# Patient Record
Sex: Female | Born: 2000
Health system: Southern US, Community
[De-identification: ages and names within clinical notes are randomized; demographics above are authoritative.]

## PROBLEM LIST (undated history)

## (undated) DIAGNOSIS — G43909 Migraine, unspecified, not intractable, without status migrainosus: Secondary | ICD-10-CM

## (undated) DIAGNOSIS — R51 Headache: Secondary | ICD-10-CM

## (undated) HISTORY — DX: Migraine, unspecified, not intractable, without status migrainosus: G43.909

## (undated) HISTORY — DX: Headache: R51

---
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
## 2018-02-07 ENCOUNTER — Other Ambulatory Visit: Payer: Self-pay | Admitting: Physician Assistant

## 2018-04-10 ENCOUNTER — Ambulatory Visit: Payer: BLUE CROSS/BLUE SHIELD | Admitting: Physician Assistant

## 2018-04-28 ENCOUNTER — Other Ambulatory Visit: Payer: Self-pay | Admitting: Physician Assistant

## 2018-05-18 ENCOUNTER — Encounter: Payer: Self-pay | Admitting: Physician Assistant

## 2018-05-18 ENCOUNTER — Ambulatory Visit: Payer: Self-pay | Admitting: Physician Assistant

## 2018-05-18 VITALS — BP 110/70 | HR 72 | Temp 98.4°F | Ht 64.0 in | Wt 216.2 lb

## 2018-05-18 DIAGNOSIS — R635 Abnormal weight gain: Secondary | ICD-10-CM

## 2018-05-18 DIAGNOSIS — N926 Irregular menstruation, unspecified: Secondary | ICD-10-CM

## 2018-05-18 DIAGNOSIS — R059 Cough, unspecified: Secondary | ICD-10-CM

## 2018-05-18 DIAGNOSIS — R05 Cough: Secondary | ICD-10-CM

## 2018-05-18 LAB — CBC
HEMATOCRIT: 38.4 % (ref 36.0–49.0)
HEMOGLOBIN: 13 g/dL (ref 12.0–16.0)
MCHC: 33.8 g/dL (ref 31.0–37.0)
MCV: 83.9 fl (ref 78.0–98.0)
PLATELETS: 254 10*3/uL (ref 150.0–575.0)
RBC: 4.57 Mil/uL (ref 3.80–5.70)
RDW: 13.4 % (ref 11.4–15.5)
WBC: 6.3 10*3/uL (ref 4.5–13.5)

## 2018-05-18 LAB — COMPREHENSIVE METABOLIC PANEL
ALBUMIN: 4 g/dL (ref 3.5–5.2)
ALT: 15 U/L (ref 0–35)
AST: 15 U/L (ref 0–37)
Alkaline Phosphatase: 43 U/L — ABNORMAL LOW (ref 47–119)
BUN: 10 mg/dL (ref 6–23)
CALCIUM: 9.4 mg/dL (ref 8.4–10.5)
CHLORIDE: 105 meq/L (ref 96–112)
CO2: 25 meq/L (ref 19–32)
CREATININE: 0.79 mg/dL (ref 0.40–1.20)
GFR: 99.58 mL/min (ref >60.00)
Glucose, Bld: 94 mg/dL (ref 70–99)
Potassium: 4 mEq/L (ref 3.5–5.1)
Sodium: 137 mEq/L (ref 135–145)
Total Bilirubin: 0.4 mg/dL (ref 0.2–1.2)
Total Protein: 7.1 g/dL (ref 6.0–8.3)

## 2018-05-18 LAB — POCT URINE PREGNANCY: PREG TEST UR: NEGATIVE

## 2018-05-18 LAB — HEMOGLOBIN A1C: Hgb A1c MFr Bld: 5.2 % (ref 4.6–6.5)

## 2018-05-18 LAB — TSH: TSH: 1.24 u[IU]/mL (ref 0.40–5.00)

## 2018-05-18 MED ORDER — LEVONORGEST-ETH ESTRAD 91-DAY 0.15-0.03 MG PO TABS: 1.0000 | ORAL_TABLET | Freq: Every day | ORAL | 4 refills | Status: DC

## 2018-05-18 MED ORDER — AMOXICILLIN-POT CLAVULANATE 875-125 MG PO TABS: 1.0000 | ORAL_TABLET | Freq: Two times a day (BID) | ORAL | 0 refills | Status: DC

## 2018-05-18 NOTE — Patient Instructions (Signed)
It was great to see you!  Start the new birth control 3 month pack. Follow-up when you come back for the winter break, sooner if concerns.  We will contact you with lab results.  For your sinus infection -- start daily Sudafed and over the counter Flonase. If symptoms do not improve, start oral Augmentin (antibiotic). [facility] if symptoms persist despite treatment.  Let's follow-up in 5 months, sooner if you have concerns.  Take care,  Jarold MottoSamantha Aysen Shieh PA-C

## 2018-05-18 NOTE — Progress Notes (Signed)
Barbara Bartlett is a 17 y.o. female here for a new problem.  I acted as a Neurosurgeon for [employer], PA-C Corky Mull, LPN  History of Present Illness:   Chief Complaint  Patient presents with  . Menstrual Problem  . Cough    Cough  This is a new problem. Episode onset: Started Monday. The problem has been gradually worsening. The problem occurs every few hours. The cough is productive of sputum Chilton Si ). Associated symptoms include ear congestion, nasal congestion, postnasal drip and a sore throat. Pertinent negatives include no chills, ear pain, fever, headaches, shortness of breath or wheezing. Nothing aggravates the symptoms. Risk factors for lung disease include occupational exposure. She has tried OTC cough suppressant for the symptoms. The treatment provided no relief. There is no history of asthma, bronchitis or pneumonia.  Female GU Problem  The patient's primary symptoms include vaginal bleeding. Primary symptoms comment: Pt having irregular periods since April,  haivng period 1/2 way through pack and then no period at end of pack. Bleeding is lasting 5 days like a normal period is heavier.. This is a new problem. Episode frequency: It has happened 2-3 times since April. The problem has been unchanged. The patient is experiencing no pain. She is not pregnant. Associated symptoms include a sore throat. Pertinent negatives include no chills, constipation, fever, headaches, nausea or vomiting. The vaginal discharge was normal. The vaginal bleeding is heavier than menses. She has not been passing clots. She has not been passing tissue. Nothing aggravates the symptoms.    Weight gain She has had 15 pound weight gain since she last saw me.  She reports that over the summer she is not at school, and has less physical activity, attributes this to her weight gain.  She denies any specific eating changes.  She denies prior history of PCOS or diabetes.  She denies family history of this as  well.   Wt Readings from Last 5 Encounters:  05/18/18 216 lb 4 oz (98.1 kg) (98 %, Z= 2.16)*  04/10/17 201 lb 8 oz (91.4 kg) (98 %, Z= 2.00)*  05/07/12 142 lb (64.4 kg) (93 %, Z= 1.47)*  10/17/11 134 lb 4.8 oz (60.9 kg) (93 %, Z= 1.44)*  05/03/11 126 lb 12.8 oz (57.5 kg) (92 %, Z= 1.40)*   * Growth percentiles are based on CDC (Girls, 2-20 Years) data.     Past Medical History:  Diagnosis Date  . Headache(784.0)   . Migraines      Social History   Socioeconomic History  . Marital status: Single    Spouse name: Not on file  . Number of children: Not on file  . Years of education: Not on file  . Highest education level: Not on file  Occupational History  . Not on file  Social Needs  . Financial resource strain: Not on file  . Food insecurity:    Worry: Not on file    Inability: Not on file  . Transportation needs:    Medical: Not on file    Non-medical: Not on file  Tobacco Use  . Smoking status: Never Smoker  . Smokeless tobacco: Never Used  Substance and Sexual Activity  . Alcohol use: No  . Drug use: No  . Sexual activity: Never  Lifestyle  . Physical activity:    Days per week: Not on file    Minutes per session: Not on file  . Stress: Not on file  Relationships  . Social connections:  Talks on phone: Not on file    Gets together: Not on file    Attends religious service: Not on file    Active member of club or organization: Not on file    Attends meetings of clubs or organizations: Not on file    Relationship status: Not on file  . Intimate partner violence:    Fear of current or ex partner: Not on file    Emotionally abused: Not on file    Physically abused: Not on file    Forced sexual activity: Not on file  Other Topics Concern  . Not on file  Social History Narrative   Going to [employer] in the fall --> elementary education    History reviewed. No pertinent surgical history.  Family History  Problem Relation Age of Onset  .  Hypertension Father   . Diabetes Maternal Aunt        diet control  . Cancer Maternal Uncle        melanoma  . Alzheimer's disease Paternal Grandmother   . Heart disease Maternal Grandmother   . Heart disease Maternal Grandfather   . Hypertension Maternal Grandfather   . Alzheimer's disease Paternal Grandfather   . Colon cancer Neg Hx   . Breast cancer Neg Hx     No Known Allergies  Current Medications:   Current Outpatient Medications:  .  acetaminophen (TYLENOL) 500 MG tablet, Take 500 mg by mouth every 6 (six) hours as needed for headache., Disp: , Rfl:  .  ibuprofen (ADVIL,MOTRIN) 200 MG tablet, Take 400 mg by mouth every 4 (four) hours as needed for headache., Disp: , Rfl:  .  SRONYX 0.1-20 MG-MCG tablet, TAKE 1 TABLET BY MOUTH EVERY DAY, Disp: 84 tablet, Rfl: 0 .  amoxicillin-clavulanate (AUGMENTIN) 875-125 MG tablet, Take 1 tablet by mouth 2 (two) times daily., Disp: 20 tablet, Rfl: 0 .  levonorgestrel-ethinyl estradiol (SEASONALE,INTROVALE,JOLESSA) 0.15-0.03 MG tablet, Take 1 tablet by mouth daily., Disp: 1 Package, Rfl: 4   Review of Systems:   Review of Systems  Constitutional: Negative for chills and fever.  HENT: Positive for postnasal drip and sore throat. Negative for ear pain.   Respiratory: Positive for cough. Negative for shortness of breath and wheezing.   Gastrointestinal: Negative for constipation, nausea and vomiting.  Neurological: Negative for headaches.    Vitals:   Vitals:   05/18/18 0811  BP: 110/70  Pulse: 72  Temp: 98.4 F (36.9 C)  TempSrc: Oral  SpO2: 98%  Weight: 216 lb 4 oz (98.1 kg)  Height: 5\' 4"  (1.626 m)     Body mass index is 37.12 kg/m.  Physical Exam:   Physical Exam  Constitutional: She appears well-developed. She is cooperative.  Non-toxic appearance. She does not have a sickly appearance. She does not appear ill. No distress.  HENT:  Head: Normocephalic and atraumatic.  Right Ear: Tympanic membrane, external ear and  ear canal normal. Tympanic membrane is not erythematous, not retracted and not bulging.  Left Ear: Tympanic membrane, external ear and ear canal normal. Tympanic membrane is not erythematous, not retracted and not bulging.  Nose: Right sinus exhibits frontal sinus tenderness. Right sinus exhibits no maxillary sinus tenderness. Left sinus exhibits frontal sinus tenderness. Left sinus exhibits no maxillary sinus tenderness.  Mouth/Throat: Uvula is midline and mucous membranes are normal. Posterior oropharyngeal erythema present. No posterior oropharyngeal edema. Tonsils are 0 on the right. Tonsils are 0 on the left.  Eyes: Conjunctivae and lids are normal.  Neck: Trachea normal.  Cardiovascular: Normal rate, regular rhythm, S1 normal, S2 normal and normal heart sounds.  Pulmonary/Chest: Effort normal and breath sounds normal. She has no decreased breath sounds. She has no wheezes. She has no rhonchi. She has no rales.  Lymphadenopathy:    She has no cervical adenopathy.  Neurological: She is alert.  Skin: Skin is warm, dry and intact.  Psychiatric: She has a normal mood and affect. Her speech is normal and behavior is normal.  Nursing note and vitals reviewed.    Assessment and Plan:    Tawonda was seen today for menstrual problem and cough.  Diagnoses and all orders for this visit:  Irregular bleeding We are going to try a 91-day birth control such as Seasonale.  I recommended that she continue to take her medication daily at the same time every day.  I am going to obtain further labs to investigate any abnormalities consistent with changes in this, including CBC, CMP, TSH, hemoglobin A1c.  I recommended that she follow-up with [place] during her winter break, she is leaving for [place]. Consider work-up for PCOS in future if symptoms persist, especially if ongoing weight gain. -     CBC -     Comprehensive metabolic panel -     TSH -     POCT urine pregnancy  Weight gain She  attributes this to her activity change over the summer.  I would like to check a hemoglobin A1c and a TSH. -     Hemoglobin A1c -     TSH -     POCT urine pregnancy  Cough No red flags on exam.  Discussed use of Sudafed and Flonase per orders. I did prescribe a safety net prescription of Augmentin should her symptoms not improve with symptomatic care. Discussed taking medications as prescribed. Reviewed return precautions including worsening fever, SOB, worsening cough or other concerns. Push fluids and rest. I recommend that patient follow-up if symptoms worsen or persist despite treatment x 7-10 days, sooner if needed.   Other orders -     levonorgestrel-ethinyl estradiol (SEASONALE,INTROVALE,JOLESSA) 0.15-0.03 MG tablet; Take 1 tablet by mouth daily. -     amoxicillin-clavulanate (AUGMENTIN) 875-125 MG tablet; Take 1 tablet by mouth 2 (two) times daily.   . Reviewed expectations re: course of current medical issues. . Discussed self-management of symptoms. . Outlined signs and symptoms indicating need for more acute intervention. . Patient verbalized understanding and all questions were answered. . See orders for this visit as documented in the electronic medical record. . Patient received an After-Visit Summary.   CMA or LPN served as scribe during this visit. History, Physical, and Plan performed by medical provider. Documentation and orders reviewed and attested to.   [name], PA-C

## 2018-07-17 ENCOUNTER — Other Ambulatory Visit: Payer: Self-pay | Admitting: Physician Assistant

## 2018-09-25 ENCOUNTER — Ambulatory Visit (INDEPENDENT_AMBULATORY_CARE_PROVIDER_SITE_OTHER): Payer: Self-pay | Admitting: *Deleted

## 2018-09-25 DIAGNOSIS — Z23 Encounter for immunization: Secondary | ICD-10-CM

## 2018-09-25 DIAGNOSIS — Z111 Encounter for screening for respiratory tuberculosis: Secondary | ICD-10-CM

## 2018-09-25 LAB — TB SKIN TEST
INDURATION: 0 mm
TB SKIN TEST: NEGATIVE

## 2018-09-25 NOTE — Progress Notes (Signed)
Per orders of,Samantha Worley, PA-C injection of Tubersol 0.1 ml left arm intradermally and Flu vaccine Left Deltoid given by Corky Mullonna Cornelia Walraven, LPN Patient tolerated injection well. Pt told to return on Thursday anytime after 11:00 AM for PPD Reading, schedule Nurse Visit. Pt verbalized understanding.

## 2018-09-27 ENCOUNTER — Ambulatory Visit: Payer: Self-pay

## 2018-09-28 ENCOUNTER — Ambulatory Visit (INDEPENDENT_AMBULATORY_CARE_PROVIDER_SITE_OTHER): Payer: BLUE CROSS/BLUE SHIELD | Admitting: *Deleted

## 2018-09-28 ENCOUNTER — Encounter: Payer: Self-pay | Admitting: *Deleted

## 2018-09-28 DIAGNOSIS — Z111 Encounter for screening for respiratory tuberculosis: Secondary | ICD-10-CM

## 2018-09-28 NOTE — Progress Notes (Signed)
Pt presented to office for PPD Reading. PPD was Negative. Paperwork completed given to pt and copy made for chart.

## 2019-01-03 ENCOUNTER — Telehealth: Payer: Self-pay

## 2019-01-03 ENCOUNTER — Ambulatory Visit: Payer: Self-pay | Admitting: Physician Assistant

## 2019-01-03 ENCOUNTER — Ambulatory Visit: Payer: BLUE CROSS/BLUE SHIELD | Admitting: Family Medicine

## 2019-01-03 NOTE — Telephone Encounter (Signed)
Hailey, please schedule Webex

## 2019-01-03 NOTE — Telephone Encounter (Signed)
Spoke to patient.  Advised that we would actually need to see her in the office for an appt for sx of UTI.  I explained that she would need to leave a sterile urine specimen that would need to be tested in office.  We cannot treat her for a UTI w/o first testing her urine.  Pt did not agree with this and chose to cancel her appt.  I advised that if she changes her mind and would like to be seen in the office, to please give our office a call to schedule appt.  She verbalized understanding.

## 2019-01-03 NOTE — Telephone Encounter (Signed)
I returned pt's call from where she left a voicemail c/o having "pain with urination".  See triage notes.   Having pain when urinating.   No other symptoms.   Had a UTI in Dec. But does not get frequent UTIs.  She did not want to come into the office.   I let her know we are doing video chats/phone call visits to avoid having to come in.    I let her know someone would be calling her to set this up.   She was agreeable to this plan.  I verified her e mail and phone number were correct.   I sent these notes to [facility] nurse pool and made them aware of her call.     Reason for Disposition . Urinating more frequently than usual (i.e., frequency)    Burning with urination.  Answer Assessment - Initial Assessment Questions 1. SYMPTOM: "What's the main symptom you're concerned about?" (e.g., frequency, incontinence)     I'm having pain with urination.    2. ONSET: "When did the  burning  start?"     Began 2-3 days ago 3. PAIN: "Is there any pain?" If so, ask: "How bad is it?" (Scale: 1-10; mild, moderate, severe)     No abd pain.   Just when urinating.   No other symptoms.  I had a UTI in Dec.   Not get often. 4. CAUSE: "What do you think is causing the symptoms?"     UTI 5. OTHER SYMPTOMS: "Do you have any other symptoms?" (e.g., fever, flank pain, blood in urine, pain with urination)     See above no other symptoms. 6. PREGNANCY: "Is there any chance you are pregnant?" "When was your last menstrual period?"     No  Protocols used: URINARY [facility]

## 2019-08-27 ENCOUNTER — Other Ambulatory Visit: Payer: Self-pay

## 2019-08-27 ENCOUNTER — Telehealth: Payer: Self-pay | Admitting: Physician Assistant

## 2019-08-27 MED ORDER — LEVONORGEST-ETH ESTRAD 91-DAY 0.15-0.03 MG PO TABS: 1.0000 | ORAL_TABLET | Freq: Every day | ORAL | 0 refills | Status: DC

## 2019-08-27 NOTE — Telephone Encounter (Signed)
She is scheduled for 09/26/19! Thanks!

## 2019-08-27 NOTE — Telephone Encounter (Signed)
Last seen on 05/18/2018. Ok to refill until appointment? Will call and have her make one before we call in.

## 2019-08-27 NOTE — Telephone Encounter (Signed)
Pt needs a refill on mylan bcp. [facility] [address] [place]. Pt will be home 09-14-2019 to 10-22-2019 and will need a cpe during that time frame

## 2019-08-27 NOTE — Telephone Encounter (Signed)
Ok to fill 

## 2019-08-27 NOTE — Telephone Encounter (Signed)
Please call to make app for when she is in town. Let me know when made so that I can call in meds.

## 2019-09-26 ENCOUNTER — Other Ambulatory Visit: Payer: Self-pay

## 2019-09-26 ENCOUNTER — Encounter: Payer: Self-pay | Admitting: Physician Assistant

## 2019-09-26 ENCOUNTER — Ambulatory Visit (INDEPENDENT_AMBULATORY_CARE_PROVIDER_SITE_OTHER): Payer: BLUE CROSS/BLUE SHIELD | Admitting: Physician Assistant

## 2019-09-26 VITALS — BP 120/78 | HR 79 | Temp 98.2°F | Ht 64.0 in | Wt 231.0 lb

## 2019-09-26 DIAGNOSIS — Z8669 Personal history of other diseases of the nervous system and sense organs: Secondary | ICD-10-CM

## 2019-09-26 DIAGNOSIS — Z0000 Encounter for general adult medical examination without abnormal findings: Secondary | ICD-10-CM | POA: Diagnosis not present

## 2019-09-26 DIAGNOSIS — E669 Obesity, unspecified: Secondary | ICD-10-CM | POA: Diagnosis not present

## 2019-09-26 DIAGNOSIS — Z13220 Encounter for screening for lipoid disorders: Secondary | ICD-10-CM

## 2019-09-26 DIAGNOSIS — Z136 Encounter for screening for cardiovascular disorders: Secondary | ICD-10-CM | POA: Diagnosis not present

## 2019-09-26 DIAGNOSIS — Z23 Encounter for immunization: Secondary | ICD-10-CM

## 2019-09-26 LAB — CBC WITH DIFFERENTIAL/PLATELET
Basophils Absolute: 0 10*3/uL (ref 0.0–0.1)
Basophils Relative: 0.7 % (ref 0.0–3.0)
Eosinophils Absolute: 0.3 10*3/uL (ref 0.0–0.7)
Eosinophils Relative: 4.6 % (ref 0.0–5.0)
HCT: 38.8 % (ref 36.0–46.0)
Hemoglobin: 13 g/dL (ref 12.0–15.0)
Lymphocytes Relative: 38.9 % (ref 12.0–46.0)
Lymphs Abs: 2.6 10*3/uL (ref 0.7–4.0)
MCHC: 33.4 g/dL (ref 30.0–36.0)
MCV: 84.4 fl (ref 78.0–100.0)
Monocytes Absolute: 0.5 10*3/uL (ref 0.1–1.0)
Monocytes Relative: 6.9 % (ref 3.0–12.0)
Neutro Abs: 3.2 10*3/uL (ref 1.4–7.7)
Neutrophils Relative %: 48.9 % (ref 43.0–77.0)
Platelets: 302 10*3/uL (ref 150.0–400.0)
RBC: 4.6 Mil/uL (ref 3.87–5.11)
RDW: 13.4 % (ref 11.5–14.6)
WBC: 6.6 10*3/uL (ref 4.5–10.5)

## 2019-09-26 LAB — LIPID PANEL
Cholesterol: 178 mg/dL (ref 0–200)
HDL: 49.3 mg/dL (ref >39.00)
LDL Cholesterol: 106 mg/dL — ABNORMAL HIGH (ref 0–99)
NonHDL: 128.85
Total CHOL/HDL Ratio: 4
Triglycerides: 115 mg/dL (ref 0.0–149.0)
VLDL: 23 mg/dL (ref 0.0–40.0)

## 2019-09-26 LAB — COMPREHENSIVE METABOLIC PANEL
ALT: 16 U/L (ref 0–35)
AST: 15 U/L (ref 0–37)
Albumin: 4.1 g/dL (ref 3.5–5.2)
Alkaline Phosphatase: 49 U/L (ref 39–117)
BUN: 11 mg/dL (ref 6–23)
CO2: 25 mEq/L (ref 19–32)
Calcium: 9.1 mg/dL (ref 8.4–10.5)
Chloride: 106 mEq/L (ref 96–112)
Creatinine, Ser: 0.68 mg/dL (ref 0.40–1.20)
GFR: 109.85 mL/min (ref >60.00)
Glucose, Bld: 88 mg/dL (ref 70–99)
Potassium: 4.1 mEq/L (ref 3.5–5.1)
Sodium: 136 mEq/L (ref 135–145)
Total Bilirubin: 0.4 mg/dL (ref 0.2–1.2)
Total Protein: 6.7 g/dL (ref 6.0–8.3)

## 2019-09-26 LAB — HEMOGLOBIN A1C: Hgb A1c MFr Bld: 5.1 % (ref 4.6–6.5)

## 2019-09-26 MED ORDER — LEVONORGEST-ETH ESTRAD 91-DAY 0.15-0.03 MG PO TABS: 1.0000 | ORAL_TABLET | Freq: Every day | ORAL | 3 refills | Status: DC

## 2019-09-26 NOTE — Patient Instructions (Addendum)
It was great to see you!  GOALS: 1. Please work on healthy choices or half portions when getting food out and about 2. Try to add in at least 2-3 days of exercise (walking counts!) during the week 3. Try to choose lower calorie drinks at [facility]  Please go to the lab for blood work.   Our office will call you with your results unless you have chosen to receive results via MyChart.  If your blood work is normal we will follow-up each year for physicals and as scheduled for chronic medical problems.  If anything is abnormal we will treat accordingly and get you in for a follow-up.  Take care,  [facility] Maintenance, Female Adopting a healthy lifestyle and getting preventive care are important in promoting health and wellness. Ask your health care provider about:  The right schedule for you to have regular tests and exams.  Things you can do on your own to prevent diseases and keep yourself healthy. What should I know about diet, weight, and exercise? Eat a healthy diet   Eat a diet that includes plenty of vegetables, fruits, low-fat dairy products, and lean protein.  Do not eat a lot of foods that are high in solid fats, added sugars, or sodium. Maintain a healthy weight Body mass index (BMI) is used to identify weight problems. It estimates body fat based on height and weight. Your health care provider can help determine your BMI and help you achieve or maintain a healthy weight. Get regular exercise Get regular exercise. This is one of the most important things you can do for your health. Most adults should:  Exercise for at least 150 minutes each week. The exercise should increase your heart rate and make you sweat (moderate-intensity exercise).  Do strengthening exercises at least twice a week. This is in addition to the moderate-intensity exercise.  Spend less time sitting. Even light physical activity can be beneficial. Watch cholesterol and blood  lipids Have your blood tested for lipids and cholesterol at 20 years of age, then have this test every 5 years. Have your cholesterol levels checked more often if:  Your lipid or cholesterol levels are high.  You are older than 20 years of age.  You are at high risk for heart disease. What should I know about cancer screening? Depending on your health history and family history, you may need to have cancer screening at various ages. This may include screening for:  Breast cancer.  Cervical cancer.  Colorectal cancer.  Skin cancer.  Lung cancer. What should I know about heart disease, diabetes, and high blood pressure? Blood pressure and heart disease  High blood pressure causes heart disease and increases the risk of stroke. This is more likely to develop in people who have high blood pressure readings, are of African descent, or are overweight.  Have your blood pressure checked: ? Every 3-5 years if you are 58-79 years of age. ? Every year if you are 10 years old or older. Diabetes Have regular diabetes screenings. This checks your fasting blood sugar level. Have the screening done:  Once every three years after age 46 if you are at a normal weight and have a low risk for diabetes.  More often and at a younger age if you are overweight or have a high risk for diabetes. What should I know about preventing infection? Hepatitis B If you have a higher risk for hepatitis B, you should be screened for this  virus. Talk with your health care provider to find out if you are at risk for hepatitis B infection. Hepatitis C Testing is recommended for:  Everyone born from [birth year] through 1967.  Anyone with known risk factors for hepatitis C. Sexually transmitted infections (STIs)  Get screened for STIs, including gonorrhea and chlamydia, if: ? You are sexually active and are younger than 20 years of age. ? You are older than 20 years of age and your health care provider tells you that  you are at risk for this type of infection. ? Your sexual activity has changed since you were last screened, and you are at increased risk for chlamydia or gonorrhea. Ask your health care provider if you are at risk.  Ask your health care provider about whether you are at high risk for HIV. Your health care provider may recommend a prescription medicine to help prevent HIV infection. If you choose to take medicine to prevent HIV, you should first get tested for HIV. You should then be tested every 3 months for as long as you are taking the medicine. Pregnancy  If you are about to stop having your period (premenopausal) and you may become pregnant, seek counseling before you get pregnant.  Take 400 to 800 micrograms (mcg) of folic acid every day if you become pregnant.  Ask for birth control (contraception) if you want to prevent pregnancy. Osteoporosis and menopause Osteoporosis is a disease in which the bones lose minerals and strength with aging. This can result in bone fractures. If you are 63 years old or older, or if you are at risk for osteoporosis and fractures, ask your health care provider if you should:  Be screened for bone loss.  Take a calcium or vitamin D supplement to lower your risk of fractures.  Be given hormone replacement therapy (HRT) to treat symptoms of menopause. Follow these instructions at home: Lifestyle  Do not use any products that contain nicotine or tobacco, such as cigarettes, e-cigarettes, and chewing tobacco. If you need help quitting, ask your health care provider.  Do not use street drugs.  Do not share needles.  Ask your health care provider for help if you need support or information about quitting drugs. Alcohol use  Do not drink alcohol if: ? Your health care provider tells you not to drink. ? You are pregnant, may be pregnant, or are planning to become pregnant.  If you drink alcohol: ? Limit how much you use to 0-1 drink a day. ? Limit  intake if you are breastfeeding.  Be aware of how much alcohol is in your drink. In the [place] one drink equals one 12 oz bottle of beer (355 mL), one 5 oz glass of wine (148 mL), or one 1 oz glass of hard liquor (44 mL). General instructions  Schedule regular health, dental, and eye exams.  Stay current with your vaccines.  Tell your health care provider if: ? You often feel depressed. ? You have ever been abused or do not feel safe at home. Summary  Adopting a healthy lifestyle and getting preventive care are important in promoting health and wellness.  Follow your health care provider's instructions about healthy diet, exercising, and getting tested or screened for diseases.  Follow your health care provider's instructions on monitoring your cholesterol and blood pressure. This information is not intended to replace advice given to you by your health care provider. Make sure you discuss any questions you have with your health care provider. Document  Released: 04/11/2011 Document Revised: 09/19/2018 Document Reviewed: 09/19/2018 Elsevier Patient Education  2020 [employer].

## 2019-09-26 NOTE — Progress Notes (Signed)
I acted as a Neurosurgeon for [employer], PA-C Corky Mull, LPN  Subjective:    Barbara Bartlett is a 18 y.o. female and is here for a comprehensive physical exam.  HPI  Health Maintenance Due  Topic Date Due  . INFLUENZA VACCINE  05/11/2019    Acute Concerns:   Chronic Issues: Headaches -- 2-3 days a week, denies: n/v, blurred/double vision, issues with concentration. Most are mild, sometimes doesn't even need medication. Denies auras. Oral contraceptive use -- uses seasonique regularly, likes this OCP. Denies excessive bleeding or pain when she does have her period q 3 months.  Health Maintenance: Immunizations -- UTD, Flu give today Colonoscopy -- N/A Mammogram -- N/A PAP -- N/A Bone Density -- N/A Diet -- eating out more at home than back at college, not a lot of healthy choices while at home, does more cooking at college Caffeine intake -- drinks [employer] most days of the week,  Sleep habits -- no issues Exercise -- hiking more at school Current Weight -- Weight: 231 lb (104.8 kg)  Weight History: Wt Readings from Last 10 Encounters:  09/26/19 231 lb (104.8 kg)  05/18/18 216 lb 4 oz (98.1 kg) (98 %, Z= 2.16)*  04/10/17 201 lb 8 oz (91.4 kg) (98 %, Z= 2.00)*  05/07/12 142 lb (64.4 kg) (93 %, Z= 1.47)*  10/17/11 134 lb 4.8 oz (60.9 kg) (93 %, Z= 1.44)*  05/03/11 126 lb 12.8 oz (57.5 kg) (92 %, Z= 1.40)*  04/27/10 116 lb 3.2 oz (52.7 kg) (93 %, Z= 1.51)*   * Growth percentiles are based on CDC (Girls, 2-20 Years) data.   Mood -- overall good No LMP recorded. (Menstrual status: Oral contraceptives).   Depression screen PHQ 2/9 09/26/2019  Decreased Interest 0  Down, Depressed, Hopeless 0  PHQ - 2 Score 0  Altered sleeping -  Tired, decreased energy -  Change in appetite -  Feeling bad or failure about yourself  -  Trouble concentrating -  Moving slowly or fidgety/restless -  Suicidal thoughts -  PHQ-9 Score -     Other  providers/specialists: Patient Care Team: [name], [place] as PCP - General (Physician Assistant)   PMHx, SurgHx, SocialHx, Medications, and Allergies were reviewed in the Visit Navigator and updated as appropriate.   Past Medical History:  Diagnosis Date  . Headache(784.0)   . Migraines     History reviewed. No pertinent surgical history.   Family History  Problem Relation Age of Onset  . Hypertension Father   . Diabetes Maternal Aunt        diet control  . Cancer Maternal Uncle        melanoma  . Alzheimer's disease Paternal Grandmother   . Heart disease Maternal Grandmother   . Heart disease Maternal Grandfather   . Hypertension Maternal Grandfather   . Alzheimer's disease Paternal Grandfather   . Colon cancer Neg Hx   . Breast cancer Neg Hx     Social History   Tobacco Use  . Smoking status: Never Smoker  . Smokeless tobacco: Never Used  Substance Use Topics  . Alcohol use: No  . Drug use: No    Review of Systems:   Review of Systems  Constitutional: Negative for chills, fever, malaise/fatigue and weight loss.  HENT: Negative for hearing loss, sinus pain and sore throat.   Eyes: Negative for blurred vision.  Respiratory: Negative for cough and shortness of breath.   Cardiovascular: Negative for chest pain, palpitations  and leg swelling.  Gastrointestinal: Negative for abdominal pain, constipation, diarrhea, heartburn, nausea and vomiting.  Genitourinary: Negative for dysuria, frequency and urgency.  Musculoskeletal: Negative for back pain, myalgias and neck pain.  Skin: Negative for itching and rash.  Neurological: Negative for dizziness, tingling, seizures, loss of consciousness and headaches.  Endo/Heme/Allergies: Negative for polydipsia.  Psychiatric/Behavioral: Negative for depression. The patient is not nervous/anxious.   All other systems reviewed and are negative.   Objective:   BP 120/78 (BP Location: Left Arm, Patient Position: Sitting,  Cuff Size: Large)   Pulse 79   Temp 98.2 F (36.8 C) (Temporal)   Ht 5\' 4"  (1.626 m)   Wt 231 lb (104.8 kg)   SpO2 97%   BMI 39.65 kg/m   General Appearance:    Alert, cooperative, no distress, appears stated age  Head:    Normocephalic, without obvious abnormality, atraumatic  Eyes:    PERRL, conjunctiva/corneas clear, EOM's intact, fundi    benign, both eyes  Ears:    Normal TM's and external ear canals, both ears  Nose:   Nares normal, septum midline, mucosa normal, no drainage    or sinus tenderness  Throat:   Lips, mucosa, and tongue normal; teeth and gums normal  Neck:   Supple, symmetrical, trachea midline, no adenopathy;    thyroid:  no enlargement/tenderness/nodules; no carotid   bruit or JVD  Back:     Symmetric, no curvature, ROM normal, no CVA tenderness  Lungs:     Clear to auscultation bilaterally, respirations unlabored  Chest Wall:    No tenderness or deformity   Heart:    Regular rate and rhythm, S1 and S2 normal, no murmur, rub   or gallop  Breast Exam:    Deferred  Abdomen:     Soft, non-tender, bowel sounds active all four quadrants,    no masses, no organomegaly  Genitalia:    Deferred  Rectal:    Deferred  Extremities:   Extremities normal, atraumatic, no cyanosis or edema  Pulses:   2+ and symmetric all extremities  Skin:   Skin color, texture, turgor normal, no rashes or lesions  Lymph nodes:   Cervical, supraclavicular, and axillary nodes normal  Neurologic:   CNII-XII intact, normal strength, sensation and reflexes    throughout      Assessment/Plan:   Barbara Bartlett was seen today for annual exam.  Diagnoses and all orders for this visit:  Routine physical examination Today patient counseled on age appropriate routine health concerns for screening and prevention, each reviewed and up to date or declined. Immunizations reviewed and up to date or declined. Labs ordered and reviewed. Risk factors for depression reviewed and negative. Hearing function and  visual acuity are intact. ADLs screened and addressed as needed. Functional ability and level of safety reviewed and appropriate. Education, counseling and referrals performed based on assessed risks today. Patient provided with a copy of personalized plan for preventive services. -     CBC with Differential/Platelet -     Comprehensive metabolic panel  Encounter for lipid screening for cardiovascular disease -     Lipid panel  Obesity, unspecified classification, unspecified obesity type, unspecified whether serious comorbidity present Did make 3 specific goals for her today: 1. Please work on healthy choices or half portions when getting food out and about 2. Try to add in at least 2-3 days of exercise (walking counts!) during the week 3. [facility] to [facility] lower [facility] Will check HgbA1c due  to weight gain. -     Hemoglobin A1c  History of migraine headaches Patient declines further intervention. No red flags on exam. Recommended that she follow-up if HA become more severe or change in any way.  Other orders -     levonorgestrel-ethinyl estradiol (SEASONALE) 0.15-0.03 MG tablet; Take 1 tablet by mouth daily.  Well Adult Exam: Labs ordered: Yes. Patient counseling was done. See below for items discussed. Discussed the patient's BMI. The BMI is not in the acceptable range; BMI management plan is completed Follow up as needed for acute illness.  Patient Counseling:   [x]     Nutrition: Stressed importance of moderation in sodium/caffeine intake, saturated fat and cholesterol, caloric balance, sufficient intake of fresh fruits, vegetables, fiber, calcium, iron, and 1 mg of folate supplement per day (for females capable of pregnancy).   [x]      Stressed the importance of regular exercise.    [x]     Substance Abuse: Discussed cessation/primary prevention of tobacco, alcohol, or other drug use; driving or other dangerous activities under the influence; availability of  treatment for abuse.    [x]      Injury prevention: Discussed safety belts, safety helmets, smoke detector, smoking near bedding or upholstery.    [x]      Sexuality: Discussed sexually transmitted diseases, partner selection, use of condoms, avoidance of unintended pregnancy  and contraceptive alternatives.    [x]     Dental health: Discussed importance of regular tooth brushing, flossing, and dental visits.   [x]      Health maintenance and immunizations reviewed. Please refer to Health maintenance section.   CMA or LPN served as scribe during this visit. History, Physical, and Plan performed by medical provider. The above documentation has been reviewed and is accurate and complete.   Inda Coke, PA-C [place]

## 2019-10-03 ENCOUNTER — Ambulatory Visit: Payer: BLUE CROSS/BLUE SHIELD | Attending: Internal Medicine

## 2019-10-03 DIAGNOSIS — Z20822 Contact with and (suspected) exposure to covid-19: Secondary | ICD-10-CM

## 2019-10-05 LAB — NOVEL CORONAVIRUS, NAA: SARS-CoV-2, NAA: NOT DETECTED

## 2019-12-25 ENCOUNTER — Other Ambulatory Visit: Payer: Self-pay | Admitting: Physician Assistant

## 2020-02-07 ENCOUNTER — Telehealth: Payer: Self-pay | Admitting: Physician Assistant

## 2020-02-07 NOTE — Telephone Encounter (Signed)
ERROR

## 2020-04-25 ENCOUNTER — Other Ambulatory Visit: Payer: Self-pay | Admitting: Physician Assistant

## 2020-04-27 ENCOUNTER — Telehealth: Payer: Self-pay | Admitting: Physician Assistant

## 2020-04-27 MED ORDER — LEVONORGESTREL-ETHINYL ESTRAD 0.15-30 MG-MCG PO TABS: 1.0000 | ORAL_TABLET | Freq: Every day | ORAL | 1 refills | Status: DC

## 2020-04-27 NOTE — Telephone Encounter (Signed)
MEDICATION: levonorgestrel-ethinyl estradiol (NORDETTE) 0.15-30 MG-MCG tablet  PHARMACY:  [id number] [place], [place] - [address] Phone:  [phone]  Fax:  [phone]       Comments: Patient would like to see if this could have refills for up to a year.   **Let patient know to contact pharmacy at the end of the day to make sure medication is ready. **  ** Please notify patient to allow 48-72 hours to process**  **Encourage patient to contact the pharmacy for refills or they can request refills through [facility]**

## 2020-04-27 NOTE — Telephone Encounter (Signed)
Rx sent to pharmacy   

## 2020-06-17 DIAGNOSIS — Z20828 Contact with and (suspected) exposure to other viral communicable diseases: Secondary | ICD-10-CM | POA: Diagnosis not present

## 2020-07-12 ENCOUNTER — Other Ambulatory Visit: Payer: Self-pay | Admitting: Physician Assistant

## 2020-07-13 DIAGNOSIS — Z20828 Contact with and (suspected) exposure to other viral communicable diseases: Secondary | ICD-10-CM | POA: Diagnosis not present

## 2020-07-21 ENCOUNTER — Ambulatory Visit: Payer: BC Managed Care – PPO | Admitting: Physician Assistant

## 2020-07-21 ENCOUNTER — Encounter: Payer: Self-pay | Admitting: Physician Assistant

## 2020-07-21 ENCOUNTER — Other Ambulatory Visit: Payer: Self-pay

## 2020-07-21 VITALS — BP 110/78 | HR 92 | Temp 98.0°F | Ht 64.0 in | Wt 231.4 lb

## 2020-07-21 DIAGNOSIS — Z3041 Encounter for surveillance of contraceptive pills: Secondary | ICD-10-CM

## 2020-07-21 DIAGNOSIS — Z111 Encounter for screening for respiratory tuberculosis: Secondary | ICD-10-CM

## 2020-07-21 DIAGNOSIS — Z23 Encounter for immunization: Secondary | ICD-10-CM | POA: Diagnosis not present

## 2020-07-21 DIAGNOSIS — G43009 Migraine without aura, not intractable, without status migrainosus: Secondary | ICD-10-CM

## 2020-07-21 DIAGNOSIS — Z0000 Encounter for general adult medical examination without abnormal findings: Secondary | ICD-10-CM | POA: Diagnosis not present

## 2020-07-21 DIAGNOSIS — E669 Obesity, unspecified: Secondary | ICD-10-CM

## 2020-07-21 MED ORDER — LEVONORGEST-ETH ESTRAD 91-DAY 0.15-0.03 MG PO TABS: 1.0000 | ORAL_TABLET | Freq: Every day | ORAL | 4 refills | Status: DC

## 2020-07-21 NOTE — Patient Instructions (Signed)
It was great to see you!  Please go to the lab for blood work for your TB blood test. We will send you the results of this through MyChart.  Please try to find a mental health counselor at school.  Continue to work on trying to be active.  I have sent in an updated birth control prescription that should be more convenient for you.  Let's do a Pap smear at your convenience.  Our office will call you with your results unless you have chosen to receive results via MyChart.  If your blood work is normal we will follow-up each year for physicals and as scheduled for chronic medical problems.  If anything is abnormal we will treat accordingly and get you in for a follow-up.  Take care,  [facility] Maintenance, Female Adopting a healthy lifestyle and getting preventive care are important in promoting health and wellness. Ask your health care provider about:  The right schedule for you to have regular tests and exams.  Things you can do on your own to prevent diseases and keep yourself healthy. What should I know about diet, weight, and exercise? Eat a healthy diet   Eat a diet that includes plenty of vegetables, fruits, low-fat dairy products, and lean protein.  Do not eat a lot of foods that are high in solid fats, added sugars, or sodium. Maintain a healthy weight Body mass index (BMI) is used to identify weight problems. It estimates body fat based on height and weight. Your health care provider can help determine your BMI and help you achieve or maintain a healthy weight. Get regular exercise Get regular exercise. This is one of the most important things you can do for your health. Most adults should:  Exercise for at least 150 minutes each week. The exercise should increase your heart rate and make you sweat (moderate-intensity exercise).  Do strengthening exercises at least twice a week. This is in addition to the moderate-intensity exercise.  Spend less time  sitting. Even light physical activity can be beneficial. Watch cholesterol and blood lipids Have your blood tested for lipids and cholesterol at 21 years of age, then have this test every 5 years. Have your cholesterol levels checked more often if:  Your lipid or cholesterol levels are high.  You are older than 21 years of age.  You are at high risk for heart disease. What should I know about cancer screening? Depending on your health history and family history, you may need to have cancer screening at various ages. This may include screening for:  Breast cancer.  Cervical cancer.  Colorectal cancer.  Skin cancer.  Lung cancer. What should I know about heart disease, diabetes, and high blood pressure? Blood pressure and heart disease  High blood pressure causes heart disease and increases the risk of stroke. This is more likely to develop in people who have high blood pressure readings, are of African descent, or are overweight.  Have your blood pressure checked: ? Every 3-5 years if you are 43-46 years of age. ? Every year if you are 80 years old or older. Diabetes Have regular diabetes screenings. This checks your fasting blood sugar level. Have the screening done:  Once every three years after age 72 if you are at a normal weight and have a low risk for diabetes.  More often and at a younger age if you are overweight or have a high risk for diabetes. What should I know about preventing  infection? Hepatitis B If you have a higher risk for hepatitis B, you should be screened for this virus. Talk with your health care provider to find out if you are at risk for hepatitis B infection. Hepatitis C Testing is recommended for:  Everyone born from [birth year] through 1967.  Anyone with known risk factors for hepatitis C. Sexually transmitted infections (STIs)  Get screened for STIs, including gonorrhea and chlamydia, if: ? You are sexually active and are younger than 21 years of  age. ? You are older than 21 years of age and your health care provider tells you that you are at risk for this type of infection. ? Your sexual activity has changed since you were last screened, and you are at increased risk for chlamydia or gonorrhea. Ask your health care provider if you are at risk.  Ask your health care provider about whether you are at high risk for HIV. Your health care provider may recommend a prescription medicine to help prevent HIV infection. If you choose to take medicine to prevent HIV, you should first get tested for HIV. You should then be tested every 3 months for as long as you are taking the medicine. Pregnancy  If you are about to stop having your period (premenopausal) and you may become pregnant, seek counseling before you get pregnant.  Take 400 to 800 micrograms (mcg) of folic acid every day if you become pregnant.  Ask for birth control (contraception) if you want to prevent pregnancy. Osteoporosis and menopause Osteoporosis is a disease in which the bones lose minerals and strength with aging. This can result in bone fractures. If you are 90 years old or older, or if you are at risk for osteoporosis and fractures, ask your health care provider if you should:  Be screened for bone loss.  Take a calcium or vitamin D supplement to lower your risk of fractures.  Be given hormone replacement therapy (HRT) to treat symptoms of menopause. Follow these instructions at home: Lifestyle  Do not use any products that contain nicotine or tobacco, such as cigarettes, e-cigarettes, and chewing tobacco. If you need help quitting, ask your health care provider.  Do not use street drugs.  Do not share needles.  Ask your health care provider for help if you need support or information about quitting drugs. Alcohol use  Do not drink alcohol if: ? Your health care provider tells you not to drink. ? You are pregnant, may be pregnant, or are planning to become  pregnant.  If you drink alcohol: ? Limit how much you use to 0-1 drink a day. ? Limit intake if you are breastfeeding.  Be aware of how much alcohol is in your drink. In the [place] one drink equals one 12 oz bottle of beer (355 mL), one 5 oz glass of wine (148 mL), or one 1 oz glass of hard liquor (44 mL). General instructions  Schedule regular health, dental, and eye exams.  Stay current with your vaccines.  Tell your health care provider if: ? You often feel depressed. ? You have ever been abused or do not feel safe at home. Summary  Adopting a healthy lifestyle and getting preventive care are important in promoting health and wellness.  Follow your health care provider's instructions about healthy diet, exercising, and getting tested or screened for diseases.  Follow your health care provider's instructions on monitoring your cholesterol and blood pressure. This information is not intended to replace advice given to you by  your health care provider. Make sure you discuss any questions you have with your health care provider. Document Revised: 09/19/2018 Document Reviewed: 09/19/2018 Elsevier Patient Education  2020 [employer].

## 2020-07-21 NOTE — Progress Notes (Signed)
Subjective:    Barbara Bartlett is a 19 y.o. female and is here for a comprehensive physical exam.  HPI  Health Maintenance Due  Topic Date Due  . Hepatitis C Screening  Never done  . PAP-Cervical Cytology Screening  Never done  . PAP SMEAR-Modifier  Never done  . INFLUENZA VACCINE  05/10/2020    Acute Concerns: None  Chronic Issues: Migraines without auras -- overall well controlled; does not get frequently. Takes OTC medication for this. Denies auras, does get light/sound sensitivity.  Health Maintenance: Immunizations -- getting flu shot and TDap today Colonoscopy -- n/a Mammogram -- n/a PAP -- counseled, defer to next visit Bone Density -- n/a Diet -- breakfast on weekends only; limited time to make breakfast; has tried dieting without significant results Caffeine intake -- coffee - 1 a day -- sugary Sleep habits -- no conerns Exercise -- walking to and from classes; will be walking a dog soon Current Weight -- Weight: 231 lb 6.1 oz (105 kg)  Weight History: Wt Readings from Last 10 Encounters:  07/21/20 231 lb 6.1 oz (105 kg)  09/26/19 231 lb (104.8 kg)  05/18/18 216 lb 4 oz (98.1 kg) (98 %, Z= 2.16)*  04/10/17 201 lb 8 oz (91.4 kg) (98 %, Z= 2.00)*  05/07/12 142 lb (64.4 kg) (93 %, Z= 1.47)*  10/17/11 134 lb 4.8 oz (60.9 kg) (93 %, Z= 1.44)*  05/03/11 126 lb 12.8 oz (57.5 kg) (92 %, Z= 1.40)*  04/27/10 116 lb 3.2 oz (52.7 kg) (93 %, Z= 1.51)*   * Growth percentiles are based on CDC (Girls, 2-20 Years) data.   Body mass index is 39.72 kg/m. Mood -- issues with anxiety at times No LMP recorded. (Menstrual status: Oral contraceptives). Birth control -- 58-month cycles  Depression screen [facility] 2/9 09/26/2019  Decreased Interest 0  Down, Depressed, Hopeless 0  PHQ - 2 Score 0  Altered sleeping -  Tired, decreased energy -  Change in appetite -  Feeling bad or failure about yourself  -  Trouble concentrating -  Moving slowly or fidgety/restless -  Suicidal  thoughts -  PHQ-9 Score -     Other providers/specialists: Patient Care Team: [name], [place] as PCP - General (Physician Assistant)   PMHx, SurgHx, SocialHx, Medications, and Allergies were reviewed in the Visit Navigator and updated as appropriate.   Past Medical History:  Diagnosis Date  . Headache(784.0)   . Migraines     History reviewed. No pertinent surgical history.   Family History  Problem Relation Age of Onset  . Hypertension Father   . Diabetes Maternal Aunt        diet control  . Cancer Maternal Uncle        melanoma  . Alzheimer's disease Paternal Grandmother   . Heart disease Maternal Grandmother   . Heart disease Maternal Grandfather   . Hypertension Maternal Grandfather   . Alzheimer's disease Paternal Grandfather   . Colon cancer Neg Hx   . Breast cancer Neg Hx     Social History   Tobacco Use  . Smoking status: Never Smoker  . Smokeless tobacco: Never Used  Vaping Use  . Vaping Use: Never used  Substance Use Topics  . Alcohol use: No  . Drug use: No    Review of Systems:   Review of Systems  Constitutional: Negative for chills, fever, malaise/fatigue and weight loss.  HENT: Negative for hearing loss, sinus pain and sore throat.   Respiratory:  Negative for cough and hemoptysis.   Cardiovascular: Negative for chest pain, palpitations, leg swelling and PND.  Gastrointestinal: Negative for abdominal pain, constipation, diarrhea, heartburn, nausea and vomiting.  Genitourinary: Negative for dysuria, frequency and urgency.  Musculoskeletal: Negative for back pain, myalgias and neck pain.  Skin: Negative for itching and rash.  Neurological: Negative for dizziness, tingling, seizures and headaches.  Endo/Heme/Allergies: Negative for polydipsia.  Psychiatric/Behavioral: Negative for depression. The patient is not nervous/anxious.     Objective:   BP 110/78 (BP Location: Left Arm, Patient Position: Sitting, Cuff Size: Large)   Pulse 92    Temp 98 F (36.7 C) (Temporal)   Ht 5\' 4"  (1.626 m)   Wt 231 lb 6.1 oz (105 kg)   BMI 39.72 kg/m   General Appearance:    Alert, cooperative, no distress, appears stated age  Head:    Normocephalic, without obvious abnormality, atraumatic  Eyes:    PERRL, conjunctiva/corneas clear, EOM's intact, fundi    benign, both eyes  Ears:    Normal TM's and external ear canals, both ears  Nose:   Nares normal, septum midline, mucosa normal, no drainage    or sinus tenderness  Throat:   Lips, mucosa, and tongue normal; teeth and gums normal  Neck:   Supple, symmetrical, trachea midline, no adenopathy;    thyroid:  no enlargement/tenderness/nodules; no carotid   bruit or JVD  Back:     Symmetric, no curvature, ROM normal, no CVA tenderness  Lungs:     Clear to auscultation bilaterally, respirations unlabored  Chest Wall:    No tenderness or deformity   Heart:    Regular rate and rhythm, S1 and S2 normal, no murmur, rub   or gallop  Breast Exam:    Deferred  Abdomen:     Soft, non-tender, bowel sounds active all four quadrants,    no masses, no organomegaly  Genitalia:    Deferred  Rectal:    Deferred  Extremities:   Extremities normal, atraumatic, no cyanosis or edema  Pulses:   2+ and symmetric all extremities  Skin:   Skin color, texture, turgor normal, no rashes or lesions  Lymph nodes:   Cervical, supraclavicular, and axillary nodes normal  Neurologic:   CNII-XII intact, normal strength, sensation and reflexes    throughout     Assessment/Plan:   Priscille was seen today for complete form.  Diagnoses and all orders for this visit:  Routine physical examination Today patient counseled on age appropriate routine health concerns for screening and prevention, each reviewed and up to date or declined. Immunizations reviewed and up to date or declined. Labs ordered and reviewed. Risk factors for depression reviewed and negative. Hearing function and visual acuity are intact. ADLs screened  and addressed as needed. Functional ability and level of safety reviewed and appropriate. Education, counseling and referrals performed based on assessed risks today. Patient provided with a copy of personalized plan for preventive services.  Need for prophylactic vaccination with combined diphtheria-tetanus-pertussis (DTP) vaccine -     Tdap vaccine greater than or equal to 7yo IM  Screening for tuberculosis -     QuantiFERON-TB Gold Plus; Future  Obesity, unspecified classification, unspecified obesity type, unspecified whether serious comorbidity present Encouraged regular diet and exercise. Avoid dieting and elimination of entire food groups. Encouraged more frequent movement.  Migraine without aura and without status migrainosus, not intractable Overall well controlled. Counseled that if she were to develop auras to let Aundra Millet know.  Contraception management  Doing well with prescription but sometimes having issues getting her rx on time when she skips her periods. She would like a different OCP if possible.  Other orders -     levonorgestrel-ethinyl estradiol (SEASONALE) 0.15-0.03 MG tablet; Take 1 tablet by mouth daily.  Well Adult Exam: Labs ordered: Yes. Patient counseling was done. See below for items discussed. Discussed the patient's BMI. The BMI is not in the acceptable range; BMI management plan is completed Follow up in one year.  Patient Counseling:   [x]     Nutrition: Stressed importance of moderation in sodium/caffeine intake, saturated fat and cholesterol, caloric balance, sufficient intake of fresh fruits, vegetables, fiber, calcium, iron, and 1 mg of folate supplement per day (for females capable of pregnancy).   [x]      Stressed the importance of regular exercise.    [x]     Substance Abuse: Discussed cessation/primary prevention of tobacco, alcohol, or other drug use; driving or other dangerous activities under the influence; availability of treatment for abuse.      [x]      Injury prevention: Discussed safety belts, safety helmets, smoke detector, smoking near bedding or upholstery.    [x]      Sexuality: Discussed sexually transmitted diseases, partner selection, use of condoms, avoidance of unintended pregnancy  and contraceptive alternatives.    [x]     Dental health: Discussed importance of regular tooth brushing, flossing, and dental visits.   [x]      Health maintenance and immunizations reviewed. Please refer to Health maintenance section.   CMA or LPN served as scribe during this visit. History, Physical, and Plan performed by medical provider. The above documentation has been reviewed and is accurate and complete.  , PA-C [facility]

## 2020-07-23 LAB — QUANTIFERON-TB GOLD PLUS
Mitogen-NIL: >10 IU/mL
NIL: 0.03 IU/mL
QuantiFERON-TB Gold Plus: NEGATIVE
TB1-NIL: 0.01 IU/mL
TB2-NIL: 0.02 IU/mL

## 2020-07-30 DIAGNOSIS — Z20828 Contact with and (suspected) exposure to other viral communicable diseases: Secondary | ICD-10-CM | POA: Diagnosis not present

## 2020-08-05 DIAGNOSIS — Z20828 Contact with and (suspected) exposure to other viral communicable diseases: Secondary | ICD-10-CM | POA: Diagnosis not present

## 2020-09-14 ENCOUNTER — Encounter: Payer: Self-pay | Admitting: Physician Assistant

## 2020-09-21 ENCOUNTER — Other Ambulatory Visit: Payer: Self-pay | Admitting: Physician Assistant

## 2021-10-08 ENCOUNTER — Other Ambulatory Visit: Payer: Self-pay | Admitting: Physician Assistant

## 2021-10-19 ENCOUNTER — Encounter: Payer: Self-pay | Admitting: Physician Assistant

## 2021-10-19 ENCOUNTER — Other Ambulatory Visit: Payer: Self-pay

## 2021-10-19 ENCOUNTER — Ambulatory Visit (INDEPENDENT_AMBULATORY_CARE_PROVIDER_SITE_OTHER): Payer: 59 | Admitting: Physician Assistant

## 2021-10-19 VITALS — BP 130/80 | HR 72 | Temp 98.4°F | Ht 65.5 in | Wt 248.0 lb

## 2021-10-19 DIAGNOSIS — G43009 Migraine without aura, not intractable, without status migrainosus: Secondary | ICD-10-CM | POA: Diagnosis not present

## 2021-10-19 DIAGNOSIS — Z136 Encounter for screening for cardiovascular disorders: Secondary | ICD-10-CM

## 2021-10-19 DIAGNOSIS — Z3041 Encounter for surveillance of contraceptive pills: Secondary | ICD-10-CM

## 2021-10-19 DIAGNOSIS — Z0000 Encounter for general adult medical examination without abnormal findings: Secondary | ICD-10-CM | POA: Diagnosis not present

## 2021-10-19 DIAGNOSIS — Z111 Encounter for screening for respiratory tuberculosis: Secondary | ICD-10-CM | POA: Diagnosis not present

## 2021-10-19 DIAGNOSIS — E669 Obesity, unspecified: Secondary | ICD-10-CM

## 2021-10-19 DIAGNOSIS — Z1159 Encounter for screening for other viral diseases: Secondary | ICD-10-CM

## 2021-10-19 DIAGNOSIS — Z13220 Encounter for screening for lipoid disorders: Secondary | ICD-10-CM

## 2021-10-19 LAB — CBC WITH DIFFERENTIAL/PLATELET
Basophils Absolute: 0 10*3/uL (ref 0.0–0.1)
Basophils Relative: 0.7 % (ref 0.0–3.0)
Eosinophils Absolute: 0.3 10*3/uL (ref 0.0–0.7)
Eosinophils Relative: 5.3 % — ABNORMAL HIGH (ref 0.0–5.0)
HCT: 39.4 % (ref 36.0–46.0)
Hemoglobin: 13.1 g/dL (ref 12.0–15.0)
Lymphocytes Relative: 34.8 % (ref 12.0–46.0)
Lymphs Abs: 2.1 10*3/uL (ref 0.7–4.0)
MCHC: 33.1 g/dL (ref 30.0–36.0)
MCV: 85 fl (ref 78.0–100.0)
Monocytes Absolute: 0.5 10*3/uL (ref 0.1–1.0)
Monocytes Relative: 7.9 % (ref 3.0–12.0)
Neutro Abs: 3 10*3/uL (ref 1.4–7.7)
Neutrophils Relative %: 51.3 % (ref 43.0–77.0)
Platelets: 273 10*3/uL (ref 150.0–400.0)
RBC: 4.64 Mil/uL (ref 3.87–5.11)
RDW: 13.1 % (ref 11.5–15.5)
WBC: 5.9 10*3/uL (ref 4.0–10.5)

## 2021-10-19 LAB — COMPREHENSIVE METABOLIC PANEL
ALT: 25 U/L (ref 0–35)
AST: 21 U/L (ref 0–37)
Albumin: 4 g/dL (ref 3.5–5.2)
Alkaline Phosphatase: 39 U/L (ref 39–117)
BUN: 10 mg/dL (ref 6–23)
CO2: 22 mEq/L (ref 19–32)
Calcium: 9.3 mg/dL (ref 8.4–10.5)
Chloride: 106 mEq/L (ref 96–112)
Creatinine, Ser: 0.73 mg/dL (ref 0.40–1.20)
GFR: 116.68 mL/min (ref >60.00)
Glucose, Bld: 82 mg/dL (ref 70–99)
Potassium: 4 mEq/L (ref 3.5–5.1)
Sodium: 135 mEq/L (ref 135–145)
Total Bilirubin: 0.4 mg/dL (ref 0.2–1.2)
Total Protein: 7.1 g/dL (ref 6.0–8.3)

## 2021-10-19 LAB — LIPID PANEL
Cholesterol: 191 mg/dL (ref 0–200)
HDL: 43.6 mg/dL (ref >39.00)
LDL Cholesterol: 115 mg/dL — ABNORMAL HIGH (ref 0–99)
NonHDL: 147.13
Total CHOL/HDL Ratio: 4
Triglycerides: 163 mg/dL — ABNORMAL HIGH (ref 0.0–149.0)
VLDL: 32.6 mg/dL (ref 0.0–40.0)

## 2021-10-19 MED ORDER — RIZATRIPTAN BENZOATE 5 MG PO TABS: 5.0000 mg | ORAL_TABLET | ORAL | 0 refills | Status: DC | PRN

## 2021-10-19 MED ORDER — LEVONORGEST-ETH ESTRAD 91-DAY 0.15-0.03 MG PO TABS: 1.0000 | ORAL_TABLET | Freq: Every day | ORAL | 4 refills | Status: DC

## 2021-10-19 NOTE — Patient Instructions (Addendum)
It was great to see you!  Once we get an ozempic or wegovy sample, we will call you to pick this up. Look up these medications to learn more if you'd like.  Migraine Recommendations: 1.  No daily medications needed at this time for preventing migraines 2.  Take Maxalt 5mg  at earliest onset of headache.  May repeat dose once in 2 hours if needed.  Do not exceed two tablets in 24 hours. 3.  Limit use of pain relievers to no more than 2 days out of the week.  These medications include acetaminophen, ibuprofen, triptans and narcotics.  This will help reduce risk of rebound headaches. 4.  Be aware of common food triggers such as processed sweets, processed foods with nitrites (such as deli meat, hot dogs, sausages), foods with MSG, alcohol (such as wine), chocolate, certain cheeses, certain fruits (dried fruits, bananas, pineapple), vinegar, diet soda. 4.  Avoid caffeine 5.  Routine exercise 6.  Proper sleep hygiene 7.  Stay adequately hydrated with water 8.  Keep a headache diary. 9.  Maintain proper stress management. 10.  Do not skip meals. 11.  Consider supplements:  Magnesium citrate 400mg  to 600mg  daily, riboflavin 400mg , Coenzyme Q 10 100mg  three times daily  Headache Precautions: Contact a doctor if: Your symptoms are not helped by medicine. You have a headache that feels different than the other headaches. You feel sick to your stomach (nauseous) or you throw up (vomit). You have a fever. Get help right away if: Your headache gets very bad quickly. Your headache gets worse after a lot of physical activity. You keep throwing up. You have a stiff neck. You have trouble seeing. You have trouble speaking. You have pain in the eye or ear. Your muscles are weak or you lose muscle control. You lose your balance or have trouble walking. You feel like you will pass out (faint) or you pass out. You are mixed up (confused). You have a seizure.    Please go to the lab for blood work.    Our office will call you with your results unless you have chosen to receive results via MyChart.  If your blood work is normal we will follow-up each year for physicals and as scheduled for chronic medical problems.  If anything is abnormal we will treat accordingly and get you in for a follow-up.  Take care,  

## 2021-10-19 NOTE — Progress Notes (Signed)
Subjective:    Barbara Bartlett is a 21 y.o. female and is here for a comprehensive physical exam.  HPI  Health Maintenance Due  Topic Date Due   Hepatitis C Screening  Never done    Acute Concerns: Obesity [name] has been struggling with her weight for the past year and has decided to get serious about losing the weight. Currently she has been adjusting her diet and is working on increasing her exercise despite the cold weather. Although she is trying to make lifestyle changes, she is interested in trialing a medication for weight loss assistance.   Chronic Issues: Migraines without Auras As of recently, Barbara Bartlett has noticed her migraines have become worse and occurring a couple of times a week. Usually she is able to take 2 Excedrin and find relief, but last migraine on 10/09/21 was only resolved with sleep. At this time she is interested in trialing a medication that could provide her with additional relief. Denies increase in stress, changes in vision, slurred speech, weakness.  Contraception Barbara Bartlett is currently compliant with taking seasonale 0.15-0.03 mg daily with no complications. States that she is managing well with this medication and will need a refill today.   Health Maintenance: Immunizations -- Covid- UTD Influenza- Due;2021 Tdap- UTD;2021 PAP -- Due Bone Density -- N/A Diet -- Eats all food groups- making healthier dietary changes Sleep habits -- Normal schedule Dentistry- UTD Ophthalmology- Will update soon Exercise -- Walking weekly Current Weight -- Stable Weight History: Wt Readings from Last 10 Encounters:  10/19/21 248 lb (112.5 kg)  07/21/20 231 lb 6.1 oz (105 kg)  09/26/19 231 lb (104.8 kg)  05/18/18 216 lb 4 oz (98.1 kg) (98 %, Z= 2.16)*  04/10/17 201 lb 8 oz (91.4 kg) (98 %, Z= 2.00)*  05/07/12 142 lb (64.4 kg) (93 %, Z= 1.47)*  10/17/11 134 lb 4.8 oz (60.9 kg) (93 %, Z= 1.44)*  05/03/11 126 lb 12.8 oz (57.5 kg) (92 %, Z= 1.40)*  04/27/10  116 lb 3.2 oz (52.7 kg) (93 %, Z= 1.51)*   * Growth percentiles are based on CDC (Girls, 2-20 Years) data.   Body mass index is 40.64 kg/m. Mood -- Stable  Patient's last menstrual period was 09/21/2021. Period characteristics -- Normal Birth control -- Seasonale 0.15-0.03 mg daily     reports no history of alcohol use.  Tobacco Use: Low Risk    Smoking Tobacco Use: Never   Smokeless Tobacco Use: Never   Passive Exposure: Not on file     Depression screen [facility] 2/9 10/19/2021  Decreased Interest 0  Down, Depressed, Hopeless 0  PHQ - 2 Score 0  Altered sleeping -  Tired, decreased energy -  Change in appetite -  Feeling bad or failure about yourself  -  Trouble concentrating -  Moving slowly or fidgety/restless -  Suicidal thoughts -  PHQ-9 Score -     Other providers/specialists: Patient Care Team: [name], [place] as PCP - General (Physician Assistant)   PMHx, SurgHx, SocialHx, Medications, and Allergies were reviewed in the Visit Navigator and updated as appropriate.   Past Medical History:  Diagnosis Date   Headache(784.0)    Migraines     History reviewed. No pertinent surgical history.   Family History  Problem Relation Age of Onset   Hypertension Father    Diabetes Maternal Aunt        diet control   Cancer Maternal Uncle  melanoma   Alzheimer's disease Paternal Grandmother    Heart disease Maternal Grandmother    Heart disease Maternal Grandfather    Hypertension Maternal Grandfather    Alzheimer's disease Paternal Grandfather    Colon cancer Neg Hx    Breast cancer Neg Hx     Social History   Tobacco Use   Smoking status: Never   Smokeless tobacco: Never  Vaping Use   Vaping Use: Never used  Substance Use Topics   Alcohol use: No   Drug use: No    Review of Systems:   Review of Systems  Constitutional:  Negative for chills, fever, malaise/fatigue and weight loss.  HENT:  Negative for hearing loss, sinus pain and sore  throat.   Respiratory:  Negative for cough and hemoptysis.   Cardiovascular:  Negative for chest pain, palpitations, leg swelling and PND.  Gastrointestinal:  Negative for abdominal pain, constipation, diarrhea, heartburn, nausea and vomiting.  Genitourinary:  Negative for dysuria, frequency and urgency.  Musculoskeletal:  Negative for back pain, myalgias and neck pain.  Skin:  Negative for itching and rash.  Neurological:  Negative for dizziness, tingling, seizures and headaches.  Endo/Heme/Allergies:  Negative for polydipsia.  Psychiatric/Behavioral:  Negative for depression. The patient is not nervous/anxious.    Objective:   BP 130/80 (BP Location: Left Arm, Patient Position: Sitting, Cuff Size: Large)    Pulse 72    Temp 98.4 F (36.9 C) (Temporal)    Ht 5' 5.5" (1.664 m)    Wt 248 lb (112.5 kg)    LMP 09/21/2021 Comment: Takes OC"s continuously x 3 months   BMI 40.64 kg/m   General Appearance:    Alert, cooperative, no distress, appears stated age  Head:    Normocephalic, without obvious abnormality, atraumatic  Eyes:    PERRL, conjunctiva/corneas clear, EOM's intact, fundi    benign, both eyes  Ears:    Normal TM's and external ear canals, both ears  Nose:   Nares normal, septum midline, mucosa normal, no drainage    or sinus tenderness  Throat:   Lips, mucosa, and tongue normal; teeth and gums normal  Neck:   Supple, symmetrical, trachea midline, no adenopathy;    thyroid:  no enlargement/tenderness/nodules; no carotid   bruit or JVD  Back:     Symmetric, no curvature, ROM normal, no CVA tenderness  Lungs:     Clear to auscultation bilaterally, respirations unlabored  Chest Wall:    No tenderness or deformity   Heart:    Regular rate and rhythm, S1 and S2 normal, no murmur, rub   or gallop  Breast Exam:    Deferred  Abdomen:    Soft, non-tender, bowel sounds active all four quadrants,    no masses, no organomegaly  Genitalia:   Deferred  Rectal:   Deferred  Extremities:    Extremities normal, atraumatic, no cyanosis or edema  Pulses:   2+ and symmetric all extremities  Skin:   Skin color, texture, turgor normal, no rashes or lesions  Lymph nodes:   Cervical, supraclavicular, and axillary nodes normal  Neurologic:   CNII-XII intact, normal strength, sensation and reflexes    throughout    Assessment/Plan:   Routine Physical Examination Today patient counseled on age appropriate routine health concerns for screening and prevention, each reviewed and up to date or declined. Immunizations reviewed and up to date or declined. Labs ordered and reviewed. Risk factors for depression reviewed and negative. Hearing function and visual acuity are intact.  ADLs screened and addressed as needed. Functional ability and level of safety reviewed and appropriate. Education, counseling and referrals performed based on assessed risks today. Patient provided with a copy of personalized plan for preventive services.  Migraine without aura and without status migrainosus, not intractable Uncontrolled Start Maxalt 5 mg daily as needed, may repeat in two hours as needed Provided patient with migraine prevention techniques  Informed patient that if new/worsening symptoms occur to reach out to office or visit ER immediately   Obesity, unspecified classification, unspecified obesity type, unspecified whether serious comorbidity present Discussed trialing a weight loss medication, such as wegovy or ozempic-- will provide sample when available  Encouraged patient to continue making healthier dietary changes and increasing daily exercise  Encounter for screening for other viral diseases  - Hepatitis C Antibody   Encounter for lipid screening for cardiovascular disease Update labs today, will start medication as indicated by lab results  - Lipid Panel  Screening-pulmonary TB Completed today   Patient Counseling:   [x]     Nutrition: Stressed importance of moderation in  sodium/caffeine intake, saturated fat and cholesterol, caloric balance, sufficient intake of fresh fruits, vegetables, fiber, calcium, iron, and 1 mg of folate supplement per day (for females capable of pregnancy).   [x]      Stressed the importance of regular exercise.    [x]     Substance Abuse: Discussed cessation/primary prevention of tobacco, alcohol, or other drug use; driving or other dangerous activities under the influence; availability of treatment for abuse.    [x]      Injury prevention: Discussed safety belts, safety helmets, smoke detector, smoking near bedding or upholstery.    [x]      Sexuality: Discussed sexually transmitted diseases, partner selection, use of condoms, avoidance of unintended pregnancy  and contraceptive alternatives.    [x]     Dental health: Discussed importance of regular tooth brushing, flossing, and dental visits.   [x]      Health maintenance and immunizations reviewed. Please refer to Health maintenance section.   I,Havlyn C Ratchford,acting as a for , PA.,have documented all relevant documentation on the behalf of , PA,as directed by  , PA while in the presence of , .  I, , Neurosurgeon, have reviewed all documentation for this visit. The documentation on 10/19/21 for the exam, diagnosis, procedures, and orders are all accurate and complete.   [name], PA-C [facility]

## 2021-10-20 ENCOUNTER — Telehealth: Payer: Self-pay | Admitting: *Deleted

## 2021-10-20 MED ORDER — WEGOVY 0.25 MG/0.5ML ~~LOC~~ SOAJ: 0.2500 mg | SUBCUTANEOUS | 0 refills | Status: DC

## 2021-10-20 NOTE — Telephone Encounter (Signed)
Spoke to pt told her I just received samples of Wegovy to help with weight loss, you can come by and pick up sample just ask for Barbara Bartlett. Pt verbalized understanding.

## 2021-10-20 NOTE — Telephone Encounter (Signed)
Pt came by the to pickup sample.

## 2021-10-22 LAB — QUANTIFERON-TB GOLD PLUS
Mitogen-NIL: >10 IU/mL
NIL: 0.03 IU/mL
QuantiFERON-TB Gold Plus: NEGATIVE
TB1-NIL: 0.01 IU/mL
TB2-NIL: 0.05 IU/mL

## 2021-10-22 LAB — HEPATITIS C ANTIBODY
Hepatitis C Ab: NONREACTIVE
SIGNAL TO CUT-OFF: 0.09 (ref <1.00)

## 2021-11-12 ENCOUNTER — Telehealth: Payer: Self-pay | Admitting: Physician Assistant

## 2021-11-12 NOTE — Telephone Encounter (Signed)
Patient wants to know if she needs to have a prescription for her next supply of Wegovy, or does she need a sample from the office. Please call the patient at [phone]. Thank You!

## 2021-11-12 NOTE — Telephone Encounter (Signed)
Left message on voicemail to call office.  

## 2021-11-15 ENCOUNTER — Telehealth: Payer: Self-pay | Admitting: Physician Assistant

## 2021-11-15 MED ORDER — WEGOVY 0.5 MG/0.5ML ~~LOC~~ SOAJ
0.5000 mg | SUBCUTANEOUS | 0 refills | Status: DC
Start: 2021-11-15 — End: 2022-04-06

## 2021-11-15 NOTE — Telephone Encounter (Signed)
See other message

## 2021-11-15 NOTE — Telephone Encounter (Signed)
Pt states she needs to talk to [facility] regarding Wegovy. Please give her a call back

## 2021-11-15 NOTE — Telephone Encounter (Signed)
Left message on voicemail to call office. Need to know what pharmacy to send [facility] Rx to?

## 2021-11-15 NOTE — Telephone Encounter (Signed)
Spoke to pt told her need to know what pharmacy to send Rx? Pt said [facility] [place]. Told her okay will send Wegovy 0.5 mg once a week to the pharmacy. Asked pt if she checked with her insurance if covered? Pt said no. Told her okay will send Rx may need PA if denied you will need to check with insurance if they cover anything for obesity. Pt verbalized understanding. Rx sent

## 2021-11-18 ENCOUNTER — Telehealth: Payer: Self-pay

## 2021-11-18 NOTE — Telephone Encounter (Signed)
Noted  

## 2021-11-18 NOTE — Telephone Encounter (Signed)
Patient has called in stating insurance will not cover wegovy.  States insurance has informed her that they will however cover ozempic. Patient would like to know if she can get [facility] script [facility].

## 2021-11-18 NOTE — Telephone Encounter (Signed)
Patient called in and states the Reginal Lutes is not covered through her insurance and wants to see if there is anything can be done. Told patient to call her insurance to see if there was any weightless medications that they do cover.

## 2021-11-19 NOTE — Telephone Encounter (Signed)
Barbara Bartlett, pt is wanting a Rx for Ozempic since Reginal Lutes was denied by insurance. Please advise if okay to send Rx?

## 2021-11-22 MED ORDER — OZEMPIC (0.25 OR 0.5 MG/DOSE) 2 MG/1.5ML ~~LOC~~ SOPN: 0.5000 mg | PEN_INJECTOR | SUBCUTANEOUS | 0 refills | Status: DC

## 2021-11-22 NOTE — Telephone Encounter (Signed)
Left message on voicemail Rx was sent to pharmacy as requested. Any questions call office.

## 2021-11-23 ENCOUNTER — Telehealth: Payer: Self-pay | Admitting: *Deleted

## 2021-11-23 NOTE — Telephone Encounter (Signed)
(  Key: [id number]) Rx #: [id number] Ozempic (0.25 or 0.5 MG/DOSE) 2MG /1.5ML pen-injectors Waiting for determination

## 2021-11-26 NOTE — Telephone Encounter (Signed)
Patient notified

## 2021-11-26 NOTE — Telephone Encounter (Signed)
°  Request Reference Number: [id number]. OZEMPIC INJ 2/1.5ML is denied for not meeting the prior authorization requirement

## 2021-12-02 ENCOUNTER — Telehealth: Payer: Self-pay | Admitting: *Deleted

## 2021-12-02 NOTE — Telephone Encounter (Signed)
(  Key: [id number]) Rx #: [id number] Ozempic (0.25 or 0.5 MG/DOSE) 2MG /1.5ML pen-injectors Waiting for determination

## 2021-12-13 ENCOUNTER — Other Ambulatory Visit: Payer: Self-pay | Admitting: Physician Assistant

## 2021-12-15 NOTE — Telephone Encounter (Signed)
We received your request for prior authorization for [facility], for the above member; however, [employer] has a denied request on file for OZEMPIC  ?Denied request send to be scan on patient chart  ?Left voice message to patient with denied information  ?

## 2021-12-20 NOTE — Telephone Encounter (Signed)
Patient verified DOB, advised denial of Ozempic and stated she spoke with insurance when [facility] was originally denied. She states she doesn't think any will be covered under her plan. Pt will make an appt to discuss further if needed. Pt verbalized understanding.  ?

## 2021-12-24 ENCOUNTER — Telehealth: Payer: Self-pay | Admitting: *Deleted

## 2021-12-24 NOTE — Telephone Encounter (Signed)
(  Key: [id number]) ?Rx #: [id number] ?Ozempic (0.25 or 0.5 MG/DOSE) 2MG /1.5ML pen-injectors ?Waiting for determination  ?

## 2021-12-29 ENCOUNTER — Telehealth: Payer: Self-pay

## 2021-12-29 NOTE — Telephone Encounter (Signed)
LVM for pt to advise PA for Ozempic was denied. Please reach out to insurance to see if there are any alternatives. Left callback number for pt if needing to call us back.  ?

## 2021-12-29 NOTE — Telephone Encounter (Signed)
Message from Plan ?We received your request for prior authorization for [facility], for the above member; however, [employer] has a denied request on file for [facility] for this member ?

## 2021-12-30 ENCOUNTER — Encounter: Payer: Self-pay | Admitting: Physician Assistant

## 2022-01-04 ENCOUNTER — Telehealth: Payer: Self-pay | Admitting: *Deleted

## 2022-01-04 NOTE — Telephone Encounter (Signed)
(  Key: [id number]) ?Rx #: [id number] ?Ozempic (0.25 or 0.5 MG/DOSE) 2MG /1.5ML pen-injectors ?Waiting for determination ?

## 2022-01-04 NOTE — Telephone Encounter (Signed)
denied request on file for [facility] for this member. ?Letter for cancellation printed and placed to be scan on pt chart  ?

## 2022-01-05 NOTE — Telephone Encounter (Signed)
Message was sent to pt thru MyChart to F/U with insurance carrier for an alternative for Ozempic that they would cover. ?

## 2022-01-17 ENCOUNTER — Telehealth: Payer: Self-pay | Admitting: *Deleted

## 2022-01-17 NOTE — Telephone Encounter (Signed)
PA  (Key: [id number]) ?Rx #: [id number] ?Ozempic (0.25 or 0.5 MG/DOSE) 2MG /1.5ML pen-injectors ?Waiting for determination  ?

## 2022-01-18 NOTE — Telephone Encounter (Signed)
OZEMPIC INJ 2/1.5ML, for the above member; however, [employer] has a denied request on file for OZEMPIC INJ 2/1.5ML for this member. ?

## 2022-01-19 NOTE — Telephone Encounter (Signed)
Pt is already aware, see My Chart messages. ?

## 2022-04-06 ENCOUNTER — Encounter: Payer: Self-pay | Admitting: Physician Assistant

## 2022-04-06 ENCOUNTER — Ambulatory Visit: Payer: 59 | Admitting: Physician Assistant

## 2022-04-06 VITALS — BP 100/64 | HR 88 | Temp 98.7°F | Ht 65.5 in | Wt 242.2 lb

## 2022-04-06 DIAGNOSIS — Z111 Encounter for screening for respiratory tuberculosis: Secondary | ICD-10-CM | POA: Diagnosis not present

## 2022-04-06 DIAGNOSIS — G43009 Migraine without aura, not intractable, without status migrainosus: Secondary | ICD-10-CM | POA: Diagnosis not present

## 2022-04-06 LAB — QUANTIFERON-TB GOLD PLUS

## 2022-04-06 MED ORDER — SUMATRIPTAN SUCCINATE 25 MG PO TABS: 25.0000 mg | ORAL_TABLET | ORAL | 0 refills | Status: DC | PRN

## 2022-04-06 NOTE — Addendum Note (Signed)
Addended by: Laddie Aquas A on: 04/06/2022 02:59 PM   Modules accepted: Orders

## 2022-04-06 NOTE — Progress Notes (Signed)
Barbara Bartlett is a 21 y.o. female here for a new problem of form completion.   History of Present Illness:   Chief Complaint  Patient presents with   Complete form    Pt is needing a form completed and labs for new job.    HPI  TB screening  Patient is here for TB screening and form completion. She is also requesting updated blood work for her new job. 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   Migraines without Auras  Patient is currently taking Maxalt 5 mg as needed with no complications. She was started on Maxalt in our previous visit. Today, she notes this has not helped. She still has been experiencing migraines which has been occurring once a week. She is also taking 2 Excedrin and does feel like this has been helping. Her symptoms seems to be improving since last visit. However, she does not find Maxalt to be beneficial for her. States this is worse with light sensitivity. Denies increase in stress, changes in vision, slurred speech or weakness. No associated nausea or vomiting. No associated photophobia,numbness, or focal deficit.  Past Medical History:  Diagnosis Date   Headache(784.0)    Migraines      Social History   Tobacco Use   Smoking status: Never   Smokeless tobacco: Never  Vaping Use   Vaping Use: Never used  Substance Use Topics   Alcohol use: Yes    Alcohol/week: 3.0 standard drinks of alcohol    Types: 3 Glasses of wine per week   Drug use: No    History reviewed. No pertinent surgical history.  Family History  Problem Relation Age of Onset   Hypertension Father    Diabetes Maternal Aunt        diet control   Cancer Maternal Uncle        melanoma   Alzheimer's disease Paternal Grandmother    Heart disease Maternal Grandmother    Heart disease Maternal Grandfather    Hypertension Maternal Grandfather    Alzheimer's disease Paternal Grandfather    Colon cancer Neg Hx    Breast cancer Neg Hx     No Known Allergies  Current  Medications:   Current Outpatient Medications:    acetaminophen (TYLENOL) 500 MG tablet, Take 500 mg by mouth every 6 (six) hours as needed for headache., Disp: , Rfl:    ibuprofen (ADVIL,MOTRIN) 200 MG tablet, Take 400 mg by mouth every 4 (four) hours as needed for headache., Disp: , Rfl:    levonorgestrel-ethinyl estradiol (SEASONALE) 0.15-0.03 MG tablet, Take 1 tablet by mouth daily., Disp: 91 tablet, Rfl: 4   Review of Systems:   ROS Negative unless otherwise specified per HPI.   Vitals:   Vitals:   04/06/22 1017  BP: 100/64  Pulse: 88  Temp: 98.7 F (37.1 C)  TempSrc: Temporal  SpO2: 95%  Weight: 242 lb 4 oz (109.9 kg)  Height: 5' 5.5" (1.664 m)     Body mass index is 39.7 kg/m.  Physical Exam:   Physical Exam Vitals and nursing note reviewed.  Constitutional:      General: She is not in acute distress.    Appearance: She is well-developed. She is not ill-appearing or toxic-appearing.  Cardiovascular:     Rate and Rhythm: Normal rate and regular rhythm.     Pulses: Normal pulses.     Heart sounds: Normal heart sounds, S1 normal and S2 normal.  Pulmonary:  Effort: Pulmonary effort is normal.     Breath sounds: Normal breath sounds.  Skin:    General: Skin is warm and dry.  Neurological:     Mental Status: She is alert.     GCS: GCS eye subscore is 4. GCS verbal subscore is 5. GCS motor subscore is 6.  Psychiatric:        Speech: Speech normal.        Behavior: Behavior normal. Behavior is cooperative.     Assessment and Plan:   Screening-pulmonary TB Will update TB gold test today and complete form  Migraine without aura and without status migrainosus, not intractable Uncontrolled No red flags Trial imitrex 25 mg prn Consider supplements -- provided on AVS If no improvement, consider Nurtec If worsening, will refer to neuro  I,Savera Zaman,acting as a scribe for [employer], PA.,have documented all relevant documentation on the behalf of  [name], PA,as directed by  [name], PA while in the presence of [name], [place].   I, [name], [place], have reviewed all documentation for this visit. The documentation on 04/06/22 for the exam, diagnosis, procedures, and orders are all accurate and complete.   [name], PA-C

## 2022-04-06 NOTE — Patient Instructions (Signed)
It was great to see you!  We will be in touch with your TB test results  Migraine Recommendations: 1.  We will not start daily preventative medication at this time 2.  Take 25 mg imitrex at earliest onset of headache.  May repeat dose once in 2 hours if needed.  Do not exceed two tablets in 24 hours. 3.  Limit use of pain relievers to no more than 2 days out of the week.  These medications include acetaminophen, ibuprofen, triptans and narcotics.  This will help reduce risk of rebound headaches. 4.  Be aware of common food triggers such as processed sweets, processed foods with nitrites (such as deli meat, hot dogs, sausages), foods with MSG, alcohol (such as wine), chocolate, certain cheeses, certain fruits (dried fruits, bananas, pineapple), vinegar, diet soda. 4.  Avoid caffeine 5.  Routine exercise 6.  Proper sleep hygiene 7.  Stay adequately hydrated with water 8.  Keep a headache diary. 9.  Maintain proper stress management. 10.  Do not skip meals. 11.  Consider supplements:  Magnesium citrate 400mg  to 600mg  daily, riboflavin 400mg , Coenzyme Q 10 100mg  three times daily  Keep me posted on your migraines!

## 2022-04-09 LAB — QUANTIFERON-TB GOLD PLUS
Mitogen-NIL: 7.87 IU/mL
NIL: 0.04 IU/mL
QuantiFERON-TB Gold Plus: NEGATIVE
TB1-NIL: 0.01 IU/mL
TB2-NIL: 0.02 IU/mL

## 2022-04-13 ENCOUNTER — Telehealth: Payer: Self-pay | Admitting: Physician Assistant

## 2022-04-13 NOTE — Telephone Encounter (Signed)
See result notes. 

## 2022-04-13 NOTE — Telephone Encounter (Signed)
Patient returned call and requests to be called at ph# [phone].

## 2022-05-04 ENCOUNTER — Other Ambulatory Visit: Payer: Self-pay | Admitting: Physician Assistant

## 2022-07-04 ENCOUNTER — Encounter: Payer: Self-pay | Admitting: *Deleted

## 2022-09-22 ENCOUNTER — Encounter: Payer: Self-pay | Admitting: *Deleted

## 2022-10-17 ENCOUNTER — Encounter: Payer: Self-pay | Admitting: Physician Assistant

## 2022-10-17 ENCOUNTER — Ambulatory Visit: Payer: 59 | Admitting: Physician Assistant

## 2022-10-17 VITALS — BP 120/70 | HR 119 | Temp 97.7°F | Ht 65.5 in | Wt 243.5 lb

## 2022-10-17 DIAGNOSIS — J029 Acute pharyngitis, unspecified: Secondary | ICD-10-CM | POA: Diagnosis not present

## 2022-10-17 DIAGNOSIS — G43009 Migraine without aura, not intractable, without status migrainosus: Secondary | ICD-10-CM | POA: Diagnosis not present

## 2022-10-17 LAB — POCT RAPID STREP A (OFFICE): Rapid Strep A Screen: POSITIVE — AB

## 2022-10-17 MED ORDER — AMITRIPTYLINE HCL 25 MG PO TABS: 25.0000 mg | ORAL_TABLET | Freq: Every day | ORAL | 1 refills | Status: DC

## 2022-10-17 MED ORDER — AMOXICILLIN 500 MG PO CAPS: 500.0000 mg | ORAL_CAPSULE | Freq: Two times a day (BID) | ORAL | 0 refills | Status: AC

## 2022-10-17 MED ORDER — NURTEC 75 MG PO TBDP: ORAL_TABLET | ORAL | 0 refills | Status: DC

## 2022-10-17 NOTE — Patient Instructions (Signed)
It was great to see you!  You have strep Start 500 mg amoxicillin twice daily x 10 days  For your headaches: Start amitriptyline 25 mg daily (preventative) Trial 75 mg tablet of nurtec (only can take ONE tablet in 24 hours) for next migraine attack -- if this is effective for you, message me and I will send in an rx Referral to neurology Follow-up with me in 1 month to check in on the amitriptyline  Please tell someone you trust that you are starting this daily medication -- if it causes suicidal thoughts, please stop and go to If you develop suicidal thoughts, please tell someone and immediately proceed to our local 24/7 crisis center, [facility]. [address], [place],  [address] [phone].  Take care,  Inda Coke PA-C

## 2022-10-17 NOTE — Progress Notes (Signed)
Barbara Bartlett is a 22 y.o. female here for a follow up of a pre-existing problem.  History of Present Illness:   Chief Complaint  Patient presents with   Sore Throat    Pt c/o sore throat, chest and nasal congestion, ear pain started on Friday. Denies fever or chills.    Sore Throat     Sore throat Symptoms started Friday.  First had nasal congestion, then sore throat.  Throat pain worsened as the days progressed Denies: fevers, chills, n/v/d  Migraines Remain uncontrolled Maxalt and Imitrex did not help She is having migraines 1-2 times per month and general tension HA 1-2 per week She is frustrated No auras Denies new sx Does not feel its related to OCP  Past Medical History:  Diagnosis Date   Headache(784.0)    Migraines      Social History   Tobacco Use   Smoking status: Never   Smokeless tobacco: Never  Vaping Use   Vaping Use: Never used  Substance Use Topics   Alcohol use: Yes    Alcohol/week: 3.0 standard drinks of alcohol    Types: 3 Glasses of wine per week   Drug use: No    History reviewed. No pertinent surgical history.  Family History  Problem Relation Age of Onset   Hypertension Father    Diabetes Maternal Aunt        diet control   Cancer Maternal Uncle        melanoma   Alzheimer's disease Paternal Grandmother    Heart disease Maternal Grandmother    Heart disease Maternal Grandfather    Hypertension Maternal Grandfather    Alzheimer's disease Paternal Grandfather    Colon cancer Neg Hx    Breast cancer Neg Hx     No Known Allergies  Current Medications:   Current Outpatient Medications:    acetaminophen (TYLENOL) 500 MG tablet, Take 500 mg by mouth every 6 (six) hours as needed for headache., Disp: , Rfl:    ibuprofen (ADVIL,MOTRIN) 200 MG tablet, Take 400 mg by mouth every 4 (four) hours as needed for headache., Disp: , Rfl:    levonorgestrel-ethinyl estradiol (SEASONALE) 0.15-0.03 MG tablet, Take 1 tablet by mouth daily.,  Disp: 91 tablet, Rfl: 4   SUMAtriptan (IMITREX) 25 MG tablet, 1 TAB BY MOUTH EVERY 2 HOURS AS NEEDED FOR MIGRAINE,MAY REPEAT IN 2 HRS IF HEADACHE PERSISTS/RECURS, Disp: 10 tablet, Rfl: 0   Review of Systems:   ROS Negative unless otherwise specified per HPI.   Vitals:   Vitals:   10/17/22 1528  BP: 120/70  Pulse: (!) 119  Temp: 97.7 F (36.5 C)  TempSrc: Temporal  SpO2: 99%  Weight: 243 lb 8 oz (110.5 kg)  Height: 5' 5.5" (1.664 m)     Body mass index is 39.9 kg/m.  Physical Exam:   Physical Exam Vitals and nursing note reviewed.  Constitutional:      General: She is not in acute distress.    Appearance: She is well-developed. She is not ill-appearing or toxic-appearing.  HENT:     Head: Normocephalic and atraumatic.     Right Ear: Tympanic membrane, ear canal and external ear normal. Tympanic membrane is not erythematous, retracted or bulging.     Left Ear: Tympanic membrane, ear canal and external ear normal. Tympanic membrane is not erythematous, retracted or bulging.     Nose: Nose normal.     Right Sinus: No maxillary sinus tenderness or frontal sinus tenderness.  Left Sinus: No maxillary sinus tenderness or frontal sinus tenderness.     Mouth/Throat:     Pharynx: Uvula midline. Posterior oropharyngeal erythema present.     Tonsils: No tonsillar exudate. 2+ on the right. 2+ on the left.  Eyes:     General: Lids are normal.     Conjunctiva/sclera: Conjunctivae normal.  Neck:     Trachea: Trachea normal.  Cardiovascular:     Rate and Rhythm: Normal rate and regular rhythm.     Heart sounds: Normal heart sounds, S1 normal and S2 normal.  Pulmonary:     Effort: Pulmonary effort is normal.     Breath sounds: Normal breath sounds. No decreased breath sounds, wheezing, rhonchi or rales.  Lymphadenopathy:     Cervical: No cervical adenopathy.  Skin:    General: Skin is warm and dry.  Neurological:     Mental Status: She is alert.  Psychiatric:         Speech: Speech normal.        Behavior: Behavior normal. Behavior is cooperative.    Results for orders placed or performed in visit on 10/17/22  POCT rapid strep A  Result Value Ref Range   Rapid Strep A Screen Positive (A) Negative    Assessment and Plan:   Sore throat No red flags on exam.  Will initiate amoxicillin for confirmed strep per orders. Discussed taking medications as prescribed. Reviewed return precautions including worsening fever, SOB, worsening cough or other concerns. Push fluids and rest. I recommend that patient follow-up if symptoms worsen or persist despite treatment x 7-10 days, sooner if needed.   Migraine without aura and without status migrainosus, not intractable Uncontrolled Start elavil 25 mg daily for preventative  -- risks/benefits/side effects discussed Nurtec sample given for next migraine attack Follow-up with me in 1 month Referral to neuro placed If new/worsening sx, needs to reach out to [place] ASAP or go to ER   [name], PA-C

## 2022-10-19 ENCOUNTER — Encounter: Payer: Self-pay | Admitting: Neurology

## 2022-11-02 ENCOUNTER — Encounter: Payer: Self-pay | Admitting: Physician Assistant

## 2022-11-03 MED ORDER — NURTEC 75 MG PO TBDP: ORAL_TABLET | ORAL | 0 refills | Status: DC

## 2022-11-08 ENCOUNTER — Other Ambulatory Visit: Payer: Self-pay | Admitting: Physician Assistant

## 2022-11-09 ENCOUNTER — Telehealth: Payer: Self-pay | Admitting: *Deleted

## 2022-11-09 NOTE — Telephone Encounter (Signed)
PA needed for Nurtec 75 mg. PA done thru Covermymeds. Awaiting determination. Key: Barbara Bartlett

## 2022-11-10 NOTE — Telephone Encounter (Signed)
Received response thru Cover[employer]eds from [employer]. PA for Nurtec 75 mg approved for 8 tablets only per month. Effective 11/09/2022 to 11/10/2023.

## 2022-11-10 NOTE — Telephone Encounter (Signed)
Spoke to pt told her PA for Nurtec was only approved for 8 tablets per month due to not having more than 8 Migraines per month. Told pt to take as needed and keep track of your Migraines and if you have increase in Migraines please let us know and we can try to resubmit. Pt verbalized understanding.

## 2022-11-10 NOTE — Telephone Encounter (Signed)
Called [facility] pharmacy and spoke to Rob told him PA for Nurtec 75 mg has been approved for one year but only can dispense 8 tablets per month. Rob verbalized understanding.

## 2022-11-15 NOTE — Progress Notes (Signed)
Barbara Bartlett is a 22 y.o. female here for a follow-up of a pre-existing problem.  History of Present Illness:   Chief Complaint  Patient presents with   Migraine    Pt is still having migraines, but Nurtec is helping.    HPI  Migraines She is currently taking Elavil 25 mg daily for preventative. Reports that Nurtec is helping with frequency and duration of migraines. When she takes Nurtec her HA is gone within an hour and this makes her very happy! Denies new symptoms accompanying migraines; denies auras.   Past Medical History:  Diagnosis Date   Headache(784.0)    Migraines      Social History   Tobacco Use   Smoking status: Never   Smokeless tobacco: Never  Vaping Use   Vaping Use: Never used  Substance Use Topics   Alcohol use: Yes    Alcohol/week: 3.0 standard drinks of alcohol    Types: 3 Glasses of wine per week   Drug use: No    History reviewed. No pertinent surgical history.  Family History  Problem Relation Age of Onset   Hypertension Father    Diabetes Maternal Aunt        diet control   Cancer Maternal Uncle        melanoma   Alzheimer's disease Paternal Grandmother    Heart disease Maternal Grandmother    Heart disease Maternal Grandfather    Hypertension Maternal Grandfather    Alzheimer's disease Paternal Grandfather    Colon cancer Neg Hx    Breast cancer Neg Hx     No Known Allergies  Current Medications:   Current Outpatient Medications:    acetaminophen (TYLENOL) 500 MG tablet, Take 500 mg by mouth every 6 (six) hours as needed for headache., Disp: , Rfl:    amitriptyline (ELAVIL) 25 MG tablet, TAKE 1 TABLET BY MOUTH EVERYDAY AT BEDTIME, Disp: 90 tablet, Rfl: 0   ibuprofen (ADVIL,MOTRIN) 200 MG tablet, Take 400 mg by mouth every 4 (four) hours as needed for headache., Disp: , Rfl:    levonorgestrel-ethinyl estradiol (SEASONALE) 0.15-0.03 MG tablet, Take 1 tablet by mouth daily., Disp: 91 tablet, Rfl: 4   Rimegepant Sulfate  (NURTEC) 75 MG TBDP, TAKE ONE TABLET AS NEEDED FOR MIGRAINE IN A 24 HOUR PERIOD, Disp: 15 tablet, Rfl: 0   Review of Systems:   Review of Systems  Constitutional:  Negative for fever and malaise/fatigue.  HENT:  Negative for congestion.   Eyes:  Negative for blurred vision.  Respiratory:  Negative for cough and shortness of breath.   Cardiovascular:  Negative for chest pain, palpitations and leg swelling.  Gastrointestinal:  Negative for vomiting.  Musculoskeletal:  Negative for back pain.  Skin:  Negative for rash.  Neurological:  Negative for loss of consciousness and headaches.    Vitals:   Vitals:   11/16/22 1303  BP: 110/70  Pulse: 96  Temp: 98 F (36.7 C)  TempSrc: Temporal  SpO2: 99%  Weight: 243 lb (110.2 kg)  Height: 5' 5.5" (1.664 m)     Body mass index is 39.82 kg/m.  Physical Exam:   Physical Exam Vitals and nursing note reviewed.  Constitutional:      General: She is not in acute distress.    Appearance: She is well-developed. She is not ill-appearing or toxic-appearing.  Cardiovascular:     Rate and Rhythm: Normal rate and regular rhythm.     Pulses: Normal pulses.     Heart sounds: Normal  heart sounds, S1 normal and S2 normal.  Pulmonary:     Effort: Pulmonary effort is normal.     Breath sounds: Normal breath sounds.  Skin:    General: Skin is warm and dry.  Neurological:     Mental Status: She is alert.     GCS: GCS eye subscore is 4. GCS verbal subscore is 5. GCS motor subscore is 6.  Psychiatric:        Speech: Speech normal.        Behavior: Behavior normal. Behavior is cooperative.     Assessment and Plan:   Migraine without aura and without status migrainosus, not intractable Improving Increase Elavil to 50 mg daily Continue Nurtec 75 mg prn If no improvement with Elavil, will trial Nurtec preventative dosing Recommend send mychart message in 4-6 weeks with update -- sooner if concerns  I,Alexander Ruley,acting as a scribe for  [employer], PA.,have documented all relevant documentation on the behalf of Inda Coke, PA,as directed by  Inda Coke, PA while in the presence of Inda Coke, [place].  I, Inda Coke, [place], have reviewed all documentation for this visit. The documentation on 11/16/22 for the exam, diagnosis, procedures, and orders are all accurate and complete.  Inda Coke, PA-C

## 2022-11-16 ENCOUNTER — Ambulatory Visit: Payer: 59 | Admitting: Physician Assistant

## 2022-11-16 ENCOUNTER — Encounter: Payer: Self-pay | Admitting: Physician Assistant

## 2022-11-16 VITALS — BP 110/70 | HR 96 | Temp 98.0°F | Ht 65.5 in | Wt 243.0 lb

## 2022-11-16 DIAGNOSIS — G43009 Migraine without aura, not intractable, without status migrainosus: Secondary | ICD-10-CM

## 2022-11-16 NOTE — Patient Instructions (Signed)
It was great to see you!  Increase Amitriptyline to 50 mg daily  Message me in 4-6 weeks to let me know if this is helping with prevention  Continue Nurtec as is for now  Take care,  Inda Coke PA-C

## 2022-11-21 ENCOUNTER — Ambulatory Visit: Payer: 59 | Admitting: Physician Assistant

## 2022-11-25 LAB — HM PAP SMEAR

## 2022-12-14 ENCOUNTER — Ambulatory Visit: Payer: 59 | Admitting: Neurology

## 2022-12-31 ENCOUNTER — Other Ambulatory Visit: Payer: Self-pay | Admitting: Physician Assistant

## 2023-01-13 ENCOUNTER — Ambulatory Visit: Payer: 59 | Admitting: Physician Assistant

## 2023-01-13 ENCOUNTER — Encounter: Payer: Self-pay | Admitting: Physician Assistant

## 2023-01-13 VITALS — BP 110/70 | HR 102 | Temp 97.8°F | Ht 65.5 in | Wt 241.5 lb

## 2023-01-13 DIAGNOSIS — R21 Rash and other nonspecific skin eruption: Secondary | ICD-10-CM

## 2023-01-13 MED ORDER — DOXYCYCLINE HYCLATE 100 MG PO TABS: 100.0000 mg | ORAL_TABLET | Freq: Two times a day (BID) | ORAL | 0 refills | Status: DC

## 2023-01-13 NOTE — Progress Notes (Signed)
Barbara Bartlett is a 22 y.o. female here for a new problem.  History of Present Illness:   Chief Complaint  Patient presents with   Rash    Pt c/o rash left inner thigh, area red, warm to touch and started itching yesterday. Has not tried any medications.    HPI  Rash  Patient is complaining of a rash in her left inner thigh that is red and warm to the touch. She also states that the rash is itchy. Patient has not tried any OTC products to manage symptoms. She denies any fever and chills. Noticed yesterday. Getting larger with time.  Past Medical History:  Diagnosis Date   Headache(784.0)    Migraines      Social History   Tobacco Use   Smoking status: Never   Smokeless tobacco: Never  Vaping Use   Vaping Use: Never used  Substance Use Topics   Alcohol use: Yes    Alcohol/week: 3.0 standard drinks of alcohol    Types: 3 Glasses of wine per week   Drug use: No    History reviewed. No pertinent surgical history.  Family History  Problem Relation Age of Onset   Hypertension Father    Diabetes Maternal Aunt        diet control   Cancer Maternal Uncle        melanoma   Alzheimer's disease Paternal Grandmother    Heart disease Maternal Grandmother    Heart disease Maternal Grandfather    Hypertension Maternal Grandfather    Alzheimer's disease Paternal Grandfather    Colon cancer Neg Hx    Breast cancer Neg Hx     No Known Allergies  Current Medications:   Current Outpatient Medications:    acetaminophen (TYLENOL) 500 MG tablet, Take 500 mg by mouth every 6 (six) hours as needed for headache., Disp: , Rfl:    amitriptyline (ELAVIL) 25 MG tablet, TAKE 1 TABLET BY MOUTH EVERYDAY AT BEDTIME, Disp: 90 tablet, Rfl: 0   amoxicillin (AMOXIL) 875 MG tablet, Take 875 mg by mouth 2 (two) times daily., Disp: , Rfl:    ibuprofen (ADVIL,MOTRIN) 200 MG tablet, Take 400 mg by mouth every 4 (four) hours as needed for headache., Disp: , Rfl:    levonorgestrel-ethinyl  estradiol (SEASONALE) 0.15-0.03 MG tablet, Take 1 tablet by mouth daily., Disp: 91 tablet, Rfl: 4   Rimegepant Sulfate (NURTEC) 75 MG TBDP, TAKE ONE TABLET AS NEEDED FOR MIGRAINE IN A 24 HOUR PERIOD [INS MAX 8/30 DAYS], Disp: 8 tablet, Rfl: 2   Review of Systems:   Review of Systems  Skin:  Positive for rash (inner left thigh).    Vitals:   Vitals:   01/13/23 1115  BP: 110/70  Pulse: (!) 102  Temp: 97.8 F (36.6 C)  TempSrc: Temporal  SpO2: 98%  Weight: 241 lb 8 oz (109.5 kg)  Height: 5' 5.5" (1.664 m)     Body mass index is 39.58 kg/m.  Physical Exam:   Physical Exam Constitutional:      General: She is not in acute distress.    Appearance: Normal appearance. She is not ill-appearing.  HENT:     Head: Normocephalic and atraumatic.     Right Ear: External ear normal.     Left Ear: External ear normal.  Eyes:     Extraocular Movements: Extraocular movements intact.     Pupils: Pupils are equal, round, and reactive to light.  Cardiovascular:     Rate and Rhythm: Normal  rate and regular rhythm.     Heart sounds: Normal heart sounds. No murmur heard.    No gallop.  Pulmonary:     Effort: Pulmonary effort is normal. No respiratory distress.     Breath sounds: Normal breath sounds. No wheezing or rales.  Skin:    General: Skin is warm and dry.     Comments: Approximately 3 cm area of well demarcated erythematous area with slight warmth to medial left lower thigh; no fluctuance or significant induration  Neurological:     Mental Status: She is alert and oriented to person, place, and time.  Psychiatric:        Judgment: Judgment normal.     Assessment and Plan:   Rash No red flags Suspect possible reaction to bug bite vs early skin infection Recommendations as follows: -Hydrocortisone ointment over the counter "anti-itch" ointment -Start regular use of over the counter antihistamines such as Zyrtec (cetirizine), Claritin (loratadine), Allegra (fexofenadine), or  Xyzal (levocetirizine) daily as needed. May take twice a day if needed as long as it does not cause drowsiness.  Safety net rx of abx (doxycycline) given if any worsening spread, pain, new fever/chills with instructions to start if this occurs  Follow-up if any concerns   I,Verona Buck,acting as a scribe for [employer]antha Kymoni Lesperance, PA.,have documented all relevant documentation on the behalf of Jarold MottoSamantha Shray Hunley, PA,as directed by  Jarold MottoSamantha Genelda Roark, PA while in the presence of Jarold MottoSamantha Kariah [place], [place].  I, Jarold MottoSamantha Austynn Pridmore, [place], have reviewed all documentation for this visit. The documentation on 01/13/23 for the exam, diagnosis, procedures, and orders are all accurate and complete.  Jarold MottoSamantha Gwenivere Hiraldo, PA-C

## 2023-01-13 NOTE — Patient Instructions (Signed)
It was great to see you!  Hydrocortisone ointment over the counter "anti-itch"  Start regular use of over the counter antihistamines such as Zyrtec (cetirizine), Claritin (loratadine), Allegra (fexofenadine), or Xyzal (levocetirizine) daily as needed. May take twice a day if needed as long as it does not cause drowsiness.  If any worsening spread, pain, new fever/chills -- start antibiotic asap  Follow-up if any concerns   Take care,  [name] PA-C

## 2023-02-08 ENCOUNTER — Other Ambulatory Visit: Payer: Self-pay | Admitting: Physician Assistant

## 2023-05-02 ENCOUNTER — Other Ambulatory Visit: Payer: Self-pay | Admitting: Physician Assistant

## 2023-05-26 ENCOUNTER — Other Ambulatory Visit: Payer: Self-pay | Admitting: Physician Assistant

## 2023-08-18 ENCOUNTER — Other Ambulatory Visit: Payer: Self-pay | Admitting: *Deleted

## 2023-08-18 MED ORDER — AMITRIPTYLINE HCL 25 MG PO TABS: 25.0000 mg | ORAL_TABLET | Freq: Every day | ORAL | 0 refills | Status: DC

## 2023-09-15 ENCOUNTER — Other Ambulatory Visit: Payer: Self-pay | Admitting: Physician Assistant

## 2023-09-28 ENCOUNTER — Ambulatory Visit: Payer: 59 | Admitting: Family

## 2023-09-28 ENCOUNTER — Encounter: Payer: Self-pay | Admitting: Family

## 2023-09-28 VITALS — BP 124/74 | HR 137 | Temp 98.7°F | Ht 65.5 in | Wt 255.6 lb

## 2023-09-28 DIAGNOSIS — J029 Acute pharyngitis, unspecified: Secondary | ICD-10-CM

## 2023-09-28 DIAGNOSIS — R52 Pain, unspecified: Secondary | ICD-10-CM

## 2023-09-28 LAB — POC COVID19 BINAXNOW: SARS Coronavirus 2 Ag: NEGATIVE

## 2023-09-28 LAB — POCT RAPID STREP A (OFFICE): Rapid Strep A Screen: NEGATIVE

## 2023-09-28 NOTE — Progress Notes (Signed)
Patient ID: Barbara Bartlett, female    DOB: 2001-02-02, 22 y.o.   MRN: [id number]  Chief Complaint  Patient presents with  . Sore Throat       Discussed the use of AI scribe software for clinical note transcription with the patient, who gave verbal consent to proceed.  History of Present Illness   The patient presents with a one-day history of sore throat, body aches, chills, and nausea. She reports that the symptoms started with a mild sore throat the previous day, which has since worsened. She also notes feeling congested and experiencing body chills and aches. She has not had a fever, but reports feeling hot to the touch. She took Tylenol earlier in the day for symptom relief. She has a history of strep throat, having had it twice the previous year, but notes that her current symptoms are not as severe. She also reports developing a cough. She has not received a flu shot this year.     Assessment & Plan:     Upper Respiratory Infection - Acute onset of sore throat, congestion, body aches, and chills. No fever recorded. Negative for flu, COVID, and strep. History of strep throat twice last year. Mild cough present. -Advise ibuprofen up to 3-4 times daily with food for pain relief, fever, and body aches. -Recommend Chloraseptic throat spray and warm salt water gargles for sore throat. -Advised on use of nasal spray to prevent secondary infection. -Consider over-the-counter generic Mucinex for cough. -Ok to take DayQuil or Nyquil for symptom relief and sleep aid. -Recommend rest, increased water intake and staying home from work unless feeling better. -Provide note for work absence.  -RTO precautions provided.    Subjective:    Outpatient Medications Prior to Visit  Medication Sig Dispense Refill  . acetaminophen (TYLENOL) 500 MG tablet Take 500 mg by mouth every 6 (six) hours as needed for headache.    Marland Kitchen amitriptyline (ELAVIL) 25 MG tablet TAKE 1 TABLET BY MOUTH EVERYDAY AT BEDTIME 90  tablet 0  . ibuprofen (ADVIL,MOTRIN) 200 MG tablet Take 400 mg by mouth every 4 (four) hours as needed for headache.    . levonorgestrel-ethinyl estradiol (SEASONALE) 0.15-0.03 MG tablet Take 1 tablet by mouth daily. 91 tablet 4  . NURTEC 75 MG TBDP TAKE ONE TABLET AS NEEDED FOR MIGRAINE IN A 24 HOUR PERIOD [INS MAX 8/30 DAYS] 8 tablet 2  . amoxicillin (AMOXIL) 875 MG tablet Take 875 mg by mouth 2 (two) times daily. (Patient not taking: Reported on 09/28/2023)    . doxycycline (VIBRA-TABS) 100 MG tablet Take 1 tablet (100 mg total) by mouth 2 (two) times daily. (Patient not taking: Reported on 09/28/2023) 20 tablet 0   No facility-administered medications prior to visit.   Past Medical History:  Diagnosis Date  . Headache(784.0)   . Migraines    History reviewed. No pertinent surgical history. No Known Allergies    Objective:    Physical Exam Vitals and nursing note reviewed.  Constitutional:      Appearance: Normal appearance. She is ill-appearing.     Interventions: Face mask in place.  HENT:     Right Ear: Tympanic membrane and ear canal normal.     Left Ear: Tympanic membrane and ear canal normal.     Nose:     Right Sinus: No frontal sinus tenderness.     Left Sinus: No frontal sinus tenderness.     Mouth/Throat:     Mouth: Mucous membranes are moist.  Pharynx: Posterior oropharyngeal erythema present. No pharyngeal swelling, oropharyngeal exudate or uvula swelling.     Tonsils: No tonsillar exudate or tonsillar abscesses. 1+ on the right. 1+ on the left.  Cardiovascular:     Rate and Rhythm: Normal rate and regular rhythm.  Pulmonary:     Effort: Pulmonary effort is normal.     Breath sounds: Normal breath sounds.  Musculoskeletal:        General: Normal range of motion.  Lymphadenopathy:     Head:     Right side of head: No preauricular or posterior auricular adenopathy.     Left side of head: No preauricular or posterior auricular adenopathy.     Cervical: No  cervical adenopathy.  Skin:    General: Skin is warm and dry.  Neurological:     Mental Status: She is alert.  Psychiatric:        Mood and Affect: Mood normal.        Behavior: Behavior normal.   BP 124/74   Pulse (!) 137   Temp 98.7 F (37.1 C)   Ht 5' 5.5" (1.664 m)   Wt 255 lb 9.6 oz (115.9 kg)   SpO2 98%   BMI 41.89 kg/m  Wt Readings from Last 3 Encounters:  09/28/23 255 lb 9.6 oz (115.9 kg)  01/13/23 241 lb 8 oz (109.5 kg)  11/16/22 243 lb (110.2 kg)       Dulce Sellar, NP

## 2023-11-01 ENCOUNTER — Other Ambulatory Visit: Payer: Self-pay | Admitting: Physician Assistant

## 2023-12-30 ENCOUNTER — Other Ambulatory Visit: Payer: Self-pay | Admitting: Physician Assistant

## 2024-02-26 ENCOUNTER — Other Ambulatory Visit: Payer: Self-pay | Admitting: Physician Assistant

## 2024-07-22 ENCOUNTER — Other Ambulatory Visit: Payer: Self-pay | Admitting: Physician Assistant

## 2024-07-22 NOTE — Telephone Encounter (Signed)
 Previous rx was denied due to needing an appt, appt has been scheduled, requesting abridge rx until can be seen on 08/14/24

## 2024-07-22 NOTE — Telephone Encounter (Unsigned)
 Copied from CRM [id number]. Topic: Clinical - Medication Refill >> Jul 22, 2024  4:32 PM Fonda T wrote: Medication: NURTEC 75 MG TBDP  Previous rx was denied due to needing an appt, appt has been scheduled, requesting abridge rx until can be seen on 08/14/24  Has the patient contacted their pharmacy? Yes, advised to contact office   This is the patient's preferred pharmacy:   [id number] - [place], [place] - [address] AT [facility] OF [address] [place] [address] Phone: [phone] Fax: [phone]    Is this the correct pharmacy for this prescription? Yes If no, delete pharmacy and type the correct one.   Has the prescription been filled recently? Yes  Is the patient out of the medication? Yes  Has the patient been seen for an appointment in the last year OR does the patient have an upcoming appointment? Yes, appt is on 08/14/24  Can we respond through MyChart? Yes  Agent: Please be advised that Rx refills may take up to 3 business days. We ask that you follow-up with your pharmacy.

## 2024-07-23 MED ORDER — NURTEC 75 MG PO TBDP: ORAL_TABLET | ORAL | 0 refills | Status: DC

## 2024-08-14 ENCOUNTER — Encounter: Payer: Self-pay | Admitting: Physician Assistant

## 2024-08-14 ENCOUNTER — Ambulatory Visit: Admitting: Physician Assistant

## 2024-08-14 VITALS — BP 138/80 | HR 94 | Temp 98.8°F | Ht 65.5 in | Wt 262.0 lb

## 2024-08-14 DIAGNOSIS — G43709 Chronic migraine without aura, not intractable, without status migrainosus: Secondary | ICD-10-CM | POA: Diagnosis not present

## 2024-08-14 DIAGNOSIS — Z23 Encounter for immunization: Secondary | ICD-10-CM | POA: Diagnosis not present

## 2024-08-14 DIAGNOSIS — E669 Obesity, unspecified: Secondary | ICD-10-CM

## 2024-08-14 MED ORDER — NURTEC 75 MG PO TBDP
75.0000 mg | ORAL_TABLET | ORAL | 11 refills | Status: AC
Start: 2024-08-14 — End: ?

## 2024-08-14 NOTE — Progress Notes (Signed)
 Barbara Bartlett is a 23 y.o. female here for a follow up of a pre-existing problem.  History of Present Illness:   Chief Complaint  Patient presents with   Medical Management of Chronic Issues    Pt here for f/u on Migraines, they are still severe 2-3 times a week. Nurtec does help a lot.    Discussed the use of AI scribe software for clinical note transcription with the patient, who gave verbal consent to proceed.  History of Present Illness   Barbara Bartlett is a 23 year old female with migraines who presents for management of her migraine headaches.  She experiences migraines two to three times per week, with additional headaches occurring more frequently. Nurtec provides relief within 30 minutes to two hours, and she receives eight tablets per month. She is currently taking amitriptyline  but is uncertain of its effectiveness. Missing doses often results in a headache the following day, and it does not aid her sleep. 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   She is on Kurvelo birth control, which she does not believe has increased her migraines. She has recently gained weight and has started a gym membership and [employer] program.        Past Medical History:  Diagnosis Date   Headache(784.0)    Migraines      Social History   Tobacco Use   Smoking status: Never   Smokeless tobacco: Never  Vaping Use   Vaping status: Never Used  Substance Use Topics   Alcohol use: Yes    Alcohol/week: 3.0 standard drinks of alcohol    Types: 3 Glasses of wine per week   Drug use: No    No past surgical history on file.  Family History  Problem Relation Age of Onset   Hypertension Father    Diabetes Maternal Aunt        diet control   Cancer Maternal Uncle        melanoma   Alzheimer's disease Paternal Grandmother    Heart disease Maternal Grandmother    Heart disease Maternal Grandfather     Hypertension Maternal Grandfather    Alzheimer's disease Paternal Grandfather    Colon cancer Neg Hx    Breast cancer Neg Hx     No Known Allergies  Current Medications:   Current Outpatient Medications:    acetaminophen (TYLENOL) 500 MG tablet, Take 500 mg by mouth every 6 (six) hours as needed for headache., Disp: , Rfl:    amitriptyline  (ELAVIL ) 25 MG tablet, TAKE 1 TABLET BY MOUTH EVERYDAY AT BEDTIME, Disp: 90 tablet, Rfl: 1   ibuprofen (ADVIL,MOTRIN) 200 MG tablet, Take 400 mg by mouth every 4 (four) hours as needed for headache., Disp: , Rfl:    KURVELO 0.15-30 MG-MCG tablet, Take 1 tablet by mouth daily., Disp: , Rfl:    Rimegepant Sulfate (NURTEC) 75 MG TBDP, TAKE ONE TABLET AS NEEDED FOR MIGRAINE IN A 24 HOUR PERIOD [INS MAX 8/30 DAYS], Disp: 8 tablet, Rfl: 0   Review of Systems:   Negative unless otherwise specified per HPI.  Vitals:   Vitals:   08/14/24 1512  BP: 138/80  Pulse: 94  Temp: 98.8 F (37.1 C)  TempSrc: Temporal  SpO2: 99%  Weight: 262 lb (118.8 kg)  Height: 5' 5.5 (1.664 m)     Body mass index is 42.94 kg/m.  Physical Exam:   Physical Exam Constitutional:  Appearance: Normal appearance. She is well-developed.  HENT:     Head: Normocephalic and atraumatic.  Eyes:     General: Lids are normal.     Extraocular Movements: Extraocular movements intact.     Conjunctiva/sclera: Conjunctivae normal.  Pulmonary:     Effort: Pulmonary effort is normal.  Musculoskeletal:        General: Normal range of motion.     Cervical back: Normal range of motion and neck supple.  Skin:    General: Skin is warm and dry.  Neurological:     Mental Status: She is alert and oriented to person, place, and time.  Psychiatric:        Attention and Perception: Attention and perception normal.        Mood and Affect: Mood normal.        Behavior: Behavior normal.        Thought Content: Thought content normal.        Judgment: Judgment normal.      Assessment and Plan:   Assessment and Plan    Chronic migraine without aura without status migrainosus, not intractable  Chronic migraines 2-3 times weekly. Nurtec effective but limited by insurance. Stress and situational factors may contribute. Amitriptyline 's efficacy uncertain but necessary to prevent headaches. [place] and Qulipta considered if insurance issues persist. - Sent prescription for Nurtec to [facility]. - Consider Qulipta if insurance issues persist. - Continue amitriptyline  as prescribed. - Reassess migraine management in January with new insurance.  Obesity Recent weight gain. Started gym and [employer]. Discussed future oral weight loss medications. - Continue gym and [employer]. - Monitor for new oral weight loss medications in January.  General Health Maintenance Flu shot received.        Lucie Buttner, PA-C

## 2024-09-01 ENCOUNTER — Other Ambulatory Visit: Payer: Self-pay | Admitting: Physician Assistant

## 2024-10-04 ENCOUNTER — Other Ambulatory Visit: Payer: Self-pay | Admitting: Physician Assistant

## 2024-10-11 ENCOUNTER — Telehealth: Payer: Self-pay

## 2024-10-11 ENCOUNTER — Other Ambulatory Visit: Payer: Self-pay

## 2024-10-11 MED ORDER — AMITRIPTYLINE HCL 25 MG PO TABS: 25.0000 mg | ORAL_TABLET | Freq: Every day | ORAL | 1 refills | Status: AC

## 2024-10-11 NOTE — Telephone Encounter (Signed)
 Copied from CRM [id number]. Topic: Clinical - Prescription Issue >> Oct 11, 2024  3:11 PM Robinson H wrote: Reason for CRM: Patient refill for the amitriptyline  (ELAVIL ) 25 MG tablet was [facility] but needs to go to the [facility] on file.  Signe [phone]   Sent to [facility]

## 2024-10-24 ENCOUNTER — Telehealth: Payer: Self-pay

## 2024-10-24 ENCOUNTER — Other Ambulatory Visit (HOSPITAL_COMMUNITY): Payer: Self-pay

## 2024-10-24 NOTE — Telephone Encounter (Signed)
 Pharmacy Patient Advocate Encounter   Received notification from [facility] KEY that prior authorization for Nurtec 75MG  dispersible tablets is required/requested.   Insurance verification completed.   The patient is insured through [facility].   Per test claim: PA required; PA submitted to above mentioned insurance via Latent Key/confirmation #/EOC [facility] Status is pending

## 2024-10-25 ENCOUNTER — Other Ambulatory Visit (HOSPITAL_COMMUNITY): Payer: Self-pay

## 2024-10-25 NOTE — Telephone Encounter (Signed)
 Pharmacy Patient Advocate Encounter  Received notification from [facility] that Prior Authorization for Nurtec 75MG  dispersible tablets  has been APPROVED from 10/24/24 to 10/24/25. Ran test claim, Copay is $0.00. This test claim was processed through [facility]- copay amounts may vary at other pharmacies due to pharmacy/plan contracts, or as the patient moves through the different stages of their insurance plan.   PA #/Case ID/Reference #: [id number]
